# Patient Record
Sex: Female | Born: 1955 | Race: Black or African American | Hispanic: No | Marital: Married | Smoking: Never smoker
Health system: Southern US, Community
[De-identification: ages and names within clinical notes are randomized; demographics above are authoritative.]

## PROBLEM LIST (undated history)

## (undated) DIAGNOSIS — E7800 Pure hypercholesterolemia, unspecified: Secondary | ICD-10-CM

## (undated) DIAGNOSIS — I10 Essential (primary) hypertension: Secondary | ICD-10-CM

## (undated) DIAGNOSIS — I639 Cerebral infarction, unspecified: Secondary | ICD-10-CM

## (undated) HISTORY — PX: BREAST LUMPECTOMY: SHX2

---

## 2010-02-16 ENCOUNTER — Inpatient Hospital Stay: Payer: Self-pay | Admitting: Internal Medicine

## 2013-11-11 ENCOUNTER — Ambulatory Visit: Payer: Self-pay | Admitting: Family Medicine

## 2013-11-20 ENCOUNTER — Ambulatory Visit: Payer: Self-pay | Admitting: Family Medicine

## 2014-11-13 ENCOUNTER — Ambulatory Visit
Admit: 2014-11-13 | Disposition: A | Discharge status: Home / Self Care | Payer: Self-pay | Attending: Family Medicine | Admitting: Family Medicine

## 2016-05-30 ENCOUNTER — Encounter (HOSPITAL_COMMUNITY): Payer: Self-pay | Admitting: Cardiology

## 2016-05-30 ENCOUNTER — Emergency Department (HOSPITAL_COMMUNITY)
Admission: EM | Admit: 2016-05-30 | Discharge: 2016-05-30 | Disposition: A | Discharge status: Home / Self Care | Payer: Self-pay | Attending: Emergency Medicine | Admitting: Emergency Medicine

## 2016-05-30 DIAGNOSIS — R112 Nausea with vomiting, unspecified: Secondary | ICD-10-CM | POA: Insufficient documentation

## 2016-05-30 DIAGNOSIS — I10 Essential (primary) hypertension: Secondary | ICD-10-CM | POA: Insufficient documentation

## 2016-05-30 DIAGNOSIS — Z79899 Other long term (current) drug therapy: Secondary | ICD-10-CM | POA: Insufficient documentation

## 2016-05-30 DIAGNOSIS — R197 Diarrhea, unspecified: Secondary | ICD-10-CM | POA: Insufficient documentation

## 2016-05-30 DIAGNOSIS — Z7982 Long term (current) use of aspirin: Secondary | ICD-10-CM | POA: Insufficient documentation

## 2016-05-30 HISTORY — DX: Pure hypercholesterolemia, unspecified: E78.00

## 2016-05-30 HISTORY — DX: Essential (primary) hypertension: I10

## 2016-05-30 LAB — COMPREHENSIVE METABOLIC PANEL
ALBUMIN: 4.2 g/dL (ref 3.5–5.0)
ALT: 13 U/L — ABNORMAL LOW (ref 14–54)
ANION GAP: 7 (ref 5–15)
AST: 18 U/L (ref 15–41)
Alkaline Phosphatase: 90 U/L (ref 38–126)
BUN: 27 mg/dL — ABNORMAL HIGH (ref 6–20)
CHLORIDE: 103 mmol/L (ref 101–111)
CO2: 26 mmol/L (ref 22–32)
Calcium: 9.8 mg/dL (ref 8.9–10.3)
Creatinine, Ser: 1.14 mg/dL — ABNORMAL HIGH (ref 0.44–1.00)
GFR calc Af Amer: >60 mL/min (ref >60)
GFR calc non Af Amer: 52 mL/min — ABNORMAL LOW (ref >60)
GLUCOSE: 122 mg/dL — AB (ref 65–99)
POTASSIUM: 3.6 mmol/L (ref 3.5–5.1)
Sodium: 136 mmol/L (ref 135–145)
Total Bilirubin: 0.7 mg/dL (ref 0.3–1.2)
Total Protein: 8.1 g/dL (ref 6.5–8.1)

## 2016-05-30 LAB — CBC WITH DIFFERENTIAL/PLATELET
Basophils Absolute: 0 10*3/uL (ref 0.0–0.1)
Basophils Relative: 0 %
Eosinophils Absolute: 0 10*3/uL (ref 0.0–0.7)
Eosinophils Relative: 0 %
HEMATOCRIT: 34.3 % — AB (ref 36.0–46.0)
Hemoglobin: 11.2 g/dL — ABNORMAL LOW (ref 12.0–15.0)
LYMPHS PCT: 15 %
Lymphs Abs: 1.7 10*3/uL (ref 0.7–4.0)
MCH: 29.4 pg (ref 26.0–34.0)
MCHC: 32.7 g/dL (ref 30.0–36.0)
MCV: 90 fL (ref 78.0–100.0)
MONO ABS: 0.4 10*3/uL (ref 0.1–1.0)
Monocytes Relative: 3 %
Neutro Abs: 9.7 10*3/uL — ABNORMAL HIGH (ref 1.7–7.7)
Neutrophils Relative %: 82 %
Platelets: 347 10*3/uL (ref 150–400)
RBC: 3.81 MIL/uL — ABNORMAL LOW (ref 3.87–5.11)
RDW: 12.9 % (ref 11.5–15.5)
WBC: 11.9 10*3/uL — ABNORMAL HIGH (ref 4.0–10.5)

## 2016-05-30 LAB — LIPASE, BLOOD: Lipase: 22 U/L (ref 11–51)

## 2016-05-30 MED ORDER — ONDANSETRON HCL 8 MG PO TABS
8.0000 mg | ORAL_TABLET | Freq: Three times a day (TID) | ORAL | 0 refills | Status: DC | PRN
Start: 2016-05-30 — End: 2021-11-19

## 2016-05-30 MED ORDER — ONDANSETRON HCL 4 MG/2ML IJ SOLN
4.0000 mg | Freq: Once | INTRAMUSCULAR | Status: AC
  Administered 2016-05-30: 4 mg via INTRAVENOUS
  Filled 2016-05-30: qty 2

## 2016-05-30 MED ORDER — SODIUM CHLORIDE 0.9 % IV BOLUS (SEPSIS)
1000.0000 mL | Freq: Once | INTRAVENOUS | Status: AC
  Administered 2016-05-30: 1000 mL via INTRAVENOUS

## 2016-05-30 NOTE — ED Notes (Signed)
Pt states understanding of care given and follow up instructions.  Pt A/O x4, ambulated from ED with steady gait

## 2016-05-30 NOTE — ED Provider Notes (Signed)
[facility]-EMERGENCY DEPT Provider Note   CSN: [id number] Arrival date & time: 05/30/16  1730     History   Chief Complaint Chief Complaint  Patient presents with  . Weakness    HPI Claire Hunt is a 60 y.o. female. She presents for evaluation of nausea, vomiting and diarrhea, which started, last evening. She denies fever, chills, cough, shortness of breath, chest pain, abdominal pain, back pain, weakness or dizziness. There are no other known modifying factors.  HPI  Past Medical History:  Diagnosis Date  . Hypercholesteremia   . Hypertension     There are no active problems to display for this patient.   Past Surgical History:  Procedure Laterality Date  . BREAST LUMPECTOMY      OB History    No data available       Home Medications    Prior to Admission medications   Medication Sig Start Date End Date Taking? Authorizing Provider  aspirin EC 81 MG tablet Take 81 mg by mouth every morning.   Yes Historical Provider, MD  atorvastatin (LIPITOR) 40 MG tablet Take 40 mg by mouth every evening.    Yes Historical Provider, MD  hydrochlorothiazide (HYDRODIURIL) 12.5 MG tablet Take 12.5 mg by mouth daily.   Yes Historical Provider, MD  lisinopril (PRINIVIL,ZESTRIL) 40 MG tablet Take 40 mg by mouth daily.   Yes Historical Provider, MD  metoprolol tartrate (LOPRESSOR) 25 MG tablet Take 25 mg by mouth 2 (two) times daily.   Yes Historical Provider, MD  ondansetron (ZOFRAN) 8 MG tablet Take 1 tablet (8 mg total) by mouth every 8 (eight) hours as needed for nausea or vomiting. 05/30/16   Mancel BaleElliott Eleno Weimar, MD    Family History History reviewed. No pertinent family history.  Social History Social History  Substance Use Topics  . Smoking status: Never Smoker  . Smokeless tobacco: Never Used  . Alcohol use No     Allergies   Patient has no known allergies.   Review of Systems Review of Systems  All other systems reviewed and are negative.    Physical Exam Updated  Vital Signs BP 106/83   Pulse 69   Temp 98.2 F (36.8 C) (Oral)   Resp 15   Ht 5\' 1"  (1.549 m)   Wt 155 lb (70.3 kg)   SpO2 100%   BMI 29.29 kg/m   Physical Exam  Constitutional: She is oriented to person, place, and time. She appears well-developed and well-nourished. No distress.  HENT:  Head: Normocephalic and atraumatic.  Eyes: Conjunctivae and EOM are normal. Pupils are equal, round, and reactive to light.  Neck: Normal range of motion and phonation normal. Neck supple.  Cardiovascular: Normal rate and regular rhythm.   Pulmonary/Chest: Effort normal and breath sounds normal. She exhibits no tenderness.  Abdominal: Soft. She exhibits no distension. There is no tenderness. There is no guarding.  Musculoskeletal: Normal range of motion.  Neurological: She is alert and oriented to person, place, and time. She exhibits normal muscle tone.  Skin: Skin is warm and dry.  Psychiatric: She has a normal mood and affect. Her behavior is normal. Judgment and thought content normal.  Nursing note and vitals reviewed.    ED Treatments / Results  Labs (all labs ordered are listed, but only abnormal results are displayed) Labs Reviewed  COMPREHENSIVE METABOLIC PANEL - Abnormal; Notable for the following:       Result Value   Glucose, Bld 122 (*)    BUN 27 (*)  Creatinine, Ser 1.14 (*)    ALT 13 (*)    GFR calc non Af Amer 52 (*)    All other components within normal limits  CBC WITH DIFFERENTIAL/PLATELET - Abnormal; Notable for the following:    WBC 11.9 (*)    RBC 3.81 (*)    Hemoglobin 11.2 (*)    HCT 34.3 (*)    Neutro Abs 9.7 (*)    All other components within normal limits  LIPASE, BLOOD    EKG  EKG Interpretation  Date/Time:  Tuesday May 30 2016 17:39:54 EST Ventricular Rate:  57 PR Interval:    QRS Duration: 83 QT Interval:  421 QTC Calculation: 410 R Axis:   55 Text Interpretation:  zSinus rhythm Borderline prolonged PR interval Consider left atrial  enlargement Borderline ST elevation, inferior leads Baseline wander in lead(s) V2 since last tracing no significant change Confirmed by Effie Shy  MD, Dwayn Moravek ([id number]) on 05/30/2016 5:43:38 PM       Radiology No results found.  Procedures Procedures (including critical care time)  Medications Ordered in ED Medications  ondansetron (ZOFRAN) injection 4 mg (4 mg Intravenous Given 05/30/16 1811)  sodium chloride 0.9 % bolus 1,000 mL (0 mLs Intravenous Stopped 05/30/16 1951)     Initial Impression / Assessment and Plan / ED Course  I have reviewed the triage vital signs and the nursing notes.  Pertinent labs & imaging results that were available during my care of the patient were reviewed by me and considered in my medical decision making (see chart for details).  Clinical Course as of May 30 2040  Tue May 30, 2016  1845 High WBC: (!) 11.9 [EW]  1845 Low Hemoglobin: (!) 11.2 [EW]  1845 Slight elevation Glucose: (!) 122 [EW]  1845 Slight elevation BUN: (!) 27 [EW]  1845 Mild elevation Creatinine: (!) 1.14 [EW]    Clinical Course User Index [EW] Mancel Bale, MD    Medications  ondansetron [facility]) injection 4 mg (4 mg Intravenous Given 05/30/16 1811)  sodium chloride 0.9 % bolus 1,000 mL (0 mLs Intravenous Stopped 05/30/16 1951)    Patient Vitals for the past 24 hrs:  BP Temp Temp src Pulse Resp SpO2 Height Weight  05/30/16 1900 106/83 - - 69 15 100 % - -  05/30/16 1800 153/68 - - 62 18 99 % - -  05/30/16 1736 169/67 98.2 F (36.8 C) Oral (!) 58 16 99 % - -  05/30/16 1734 - - - - - - 5\' 1"  (1.549 m) 155 lb (70.3 kg)    8:38 PM Reevaluation with update and discussion. After initial assessment and treatment, an updated evaluation reveals No additional complaints. She has been able to tolerate just a couple ounces of oral liquids without vomiting. No diarrhea since arrival here. Findings discussed with patient and family members, all questions answered. [name]    Final  Clinical Impressions(s) / ED Diagnoses   Final diagnoses:  Nausea vomiting and diarrhea    Nonspecific, nausea, vomiting, diarrhea. Doubt serious bacterial patient. Metabolic instability or impending vascular collapse.  Nursing Notes Reviewed/ Care Coordinated Applicable Imaging Reviewed Interpretation of Laboratory Data incorporated into ED treatment  The patient appears reasonably screened and/or stabilized for discharge and I doubt any other medical condition or other [facility] requiring further screening, evaluation, or treatment in the ED at this time prior to discharge.  Plan: Home Medications- continue; Home Treatments- rest; return here if the recommended treatment, does not improve the symptoms; Recommended follow up-  PCP prn   New Prescriptions New Prescriptions   ONDANSETRON (ZOFRAN) 8 MG TABLET    Take 1 tablet (8 mg total) by mouth every 8 (eight) hours as needed for nausea or vomiting.     Mancel BaleElliott Jessie Cowher, MD 05/30/16 2041

## 2016-05-30 NOTE — ED Triage Notes (Signed)
Vomiting and diarrhea times 2 days.  Weakness since yesterday.  Near syncope episode while EMS was checking orthostatics.

## 2016-05-30 NOTE — Discharge Instructions (Signed)
Start with a clear liquid diet, then gradually advance to regular foods, over one or 2 days time.  You can use Imodium or Kaopectate for diarrhea if needed.

## 2016-05-30 NOTE — ED Notes (Signed)
Pt given ginger ale, no c/o nausea or vomiting

## 2016-06-01 ENCOUNTER — Encounter (HOSPITAL_COMMUNITY): Payer: Self-pay

## 2016-06-01 ENCOUNTER — Inpatient Hospital Stay (HOSPITAL_COMMUNITY)
Admission: EM | Admit: 2016-06-01 | Discharge: 2016-06-03 | DRG: 065 | Disposition: A | Discharge status: Home Health | Payer: Self-pay | Attending: Internal Medicine | Admitting: Internal Medicine

## 2016-06-01 DIAGNOSIS — I639 Cerebral infarction, unspecified: Principal | ICD-10-CM | POA: Diagnosis present

## 2016-06-01 DIAGNOSIS — E785 Hyperlipidemia, unspecified: Secondary | ICD-10-CM | POA: Diagnosis present

## 2016-06-01 DIAGNOSIS — Z7982 Long term (current) use of aspirin: Secondary | ICD-10-CM

## 2016-06-01 DIAGNOSIS — M6281 Muscle weakness (generalized): Secondary | ICD-10-CM

## 2016-06-01 DIAGNOSIS — I10 Essential (primary) hypertension: Secondary | ICD-10-CM | POA: Diagnosis present

## 2016-06-01 DIAGNOSIS — G8191 Hemiplegia, unspecified affecting right dominant side: Secondary | ICD-10-CM | POA: Diagnosis present

## 2016-06-01 DIAGNOSIS — Z7902 Long term (current) use of antithrombotics/antiplatelets: Secondary | ICD-10-CM

## 2016-06-01 DIAGNOSIS — E7800 Pure hypercholesterolemia, unspecified: Secondary | ICD-10-CM | POA: Diagnosis present

## 2016-06-01 NOTE — ED Triage Notes (Signed)
Pt states starting Monday she has started feeling weakness to the right side of her body with some slurring of her speech.  Sister reports seeing patient on Tuesday with same symptoms but is worse today.

## 2016-06-02 ENCOUNTER — Observation Stay (HOSPITAL_COMMUNITY): Payer: Self-pay

## 2016-06-02 ENCOUNTER — Other Ambulatory Visit (HOSPITAL_COMMUNITY): Payer: Self-pay

## 2016-06-02 ENCOUNTER — Observation Stay (HOSPITAL_BASED_OUTPATIENT_CLINIC_OR_DEPARTMENT_OTHER): Payer: Self-pay

## 2016-06-02 ENCOUNTER — Emergency Department (HOSPITAL_COMMUNITY): Payer: Self-pay

## 2016-06-02 DIAGNOSIS — E784 Other hyperlipidemia: Secondary | ICD-10-CM

## 2016-06-02 DIAGNOSIS — I639 Cerebral infarction, unspecified: Secondary | ICD-10-CM | POA: Diagnosis present

## 2016-06-02 DIAGNOSIS — E7800 Pure hypercholesterolemia, unspecified: Secondary | ICD-10-CM

## 2016-06-02 DIAGNOSIS — E785 Hyperlipidemia, unspecified: Secondary | ICD-10-CM | POA: Diagnosis present

## 2016-06-02 DIAGNOSIS — I10 Essential (primary) hypertension: Secondary | ICD-10-CM

## 2016-06-02 DIAGNOSIS — I679 Cerebrovascular disease, unspecified: Secondary | ICD-10-CM

## 2016-06-02 LAB — COMPREHENSIVE METABOLIC PANEL
ALBUMIN: 4.1 g/dL (ref 3.5–5.0)
ALK PHOS: 76 U/L (ref 38–126)
ALT: 12 U/L — AB (ref 14–54)
ANION GAP: 7 (ref 5–15)
AST: 15 U/L (ref 15–41)
BUN: 20 mg/dL (ref 6–20)
CALCIUM: 10 mg/dL (ref 8.9–10.3)
CO2: 27 mmol/L (ref 22–32)
CREATININE: 1.28 mg/dL — AB (ref 0.44–1.00)
Chloride: 103 mmol/L (ref 101–111)
GFR calc Af Amer: 52 mL/min — ABNORMAL LOW (ref >60)
GFR calc non Af Amer: 45 mL/min — ABNORMAL LOW (ref >60)
GLUCOSE: 113 mg/dL — AB (ref 65–99)
Potassium: 3.8 mmol/L (ref 3.5–5.1)
SODIUM: 137 mmol/L (ref 135–145)
Total Bilirubin: 0.6 mg/dL (ref 0.3–1.2)
Total Protein: 7.6 g/dL (ref 6.5–8.1)

## 2016-06-02 LAB — LIPID PANEL
Cholesterol: 224 mg/dL — ABNORMAL HIGH (ref 0–200)
HDL: 54 mg/dL (ref >40)
LDL CALC: 146 mg/dL — AB (ref 0–99)
TRIGLYCERIDES: 119 mg/dL (ref <150)
Total CHOL/HDL Ratio: 4.1 RATIO
VLDL: 24 mg/dL (ref 0–40)

## 2016-06-02 LAB — CBC
HEMATOCRIT: 34.6 % — AB (ref 36.0–46.0)
Hemoglobin: 11.3 g/dL — ABNORMAL LOW (ref 12.0–15.0)
MCH: 29.3 pg (ref 26.0–34.0)
MCHC: 32.7 g/dL (ref 30.0–36.0)
MCV: 89.6 fL (ref 78.0–100.0)
Platelets: 357 10*3/uL (ref 150–400)
RBC: 3.86 MIL/uL — AB (ref 3.87–5.11)
RDW: 12.8 % (ref 11.5–15.5)
WBC: 9.7 10*3/uL (ref 4.0–10.5)

## 2016-06-02 LAB — URINALYSIS, ROUTINE W REFLEX MICROSCOPIC
Bilirubin Urine: NEGATIVE
Glucose, UA: NEGATIVE mg/dL
Ketones, ur: NEGATIVE mg/dL
NITRITE: NEGATIVE
PROTEIN: NEGATIVE mg/dL
Specific Gravity, Urine: 1.02 (ref 1.005–1.030)
pH: 5.5 (ref 5.0–8.0)

## 2016-06-02 LAB — DIFFERENTIAL
Basophils Absolute: 0 10*3/uL (ref 0.0–0.1)
Basophils Relative: 0 %
Eosinophils Absolute: 0.2 10*3/uL (ref 0.0–0.7)
Eosinophils Relative: 2 %
LYMPHS ABS: 2.9 10*3/uL (ref 0.7–4.0)
LYMPHS PCT: 30 %
Monocytes Absolute: 0.4 10*3/uL (ref 0.1–1.0)
Monocytes Relative: 4 %
NEUTROS ABS: 6.2 10*3/uL (ref 1.7–7.7)
NEUTROS PCT: 64 %

## 2016-06-02 LAB — APTT: aPTT: 35 seconds (ref 24–36)

## 2016-06-02 LAB — RAPID URINE DRUG SCREEN, HOSP PERFORMED
Amphetamines: NOT DETECTED
BARBITURATES: NOT DETECTED
BENZODIAZEPINES: NOT DETECTED
Cocaine: NOT DETECTED
Opiates: NOT DETECTED
Tetrahydrocannabinol: NOT DETECTED

## 2016-06-02 LAB — TROPONIN I

## 2016-06-02 LAB — MRSA PCR SCREENING: MRSA BY PCR: NEGATIVE

## 2016-06-02 LAB — ETHANOL

## 2016-06-02 LAB — ECHOCARDIOGRAM COMPLETE
Height: 61 in
WEIGHTICAEL: 2582.03 [oz_av]

## 2016-06-02 LAB — PROTIME-INR
INR: 0.96
Prothrombin Time: 12.8 seconds (ref 11.4–15.2)

## 2016-06-02 LAB — URINE MICROSCOPIC-ADD ON

## 2016-06-02 MED ORDER — ATORVASTATIN CALCIUM 40 MG PO TABS
40.0000 mg | ORAL_TABLET | Freq: Every evening | ORAL | Status: DC
  Administered 2016-06-02: 40 mg via ORAL
  Filled 2016-06-02: qty 1

## 2016-06-02 MED ORDER — CLOPIDOGREL BISULFATE 75 MG PO TABS: 75.0000 mg | ORAL_TABLET | Freq: Every day | ORAL | 0 refills | Status: DC

## 2016-06-02 MED ORDER — CLOPIDOGREL BISULFATE 75 MG PO TABS
75.0000 mg | ORAL_TABLET | Freq: Every day | ORAL | Status: DC
  Administered 2016-06-02 – 2016-06-03 (×2): 75 mg via ORAL
  Filled 2016-06-02 (×2): qty 1

## 2016-06-02 MED ORDER — SODIUM CHLORIDE 0.9 % IV SOLN
INTRAVENOUS | Status: DC
  Administered 2016-06-02: 07:00:00 via INTRAVENOUS

## 2016-06-02 MED ORDER — ASPIRIN EC 325 MG PO TBEC
325.0000 mg | DELAYED_RELEASE_TABLET | Freq: Every morning | ORAL | Status: DC
  Administered 2016-06-03: 325 mg via ORAL
  Filled 2016-06-02: qty 1

## 2016-06-02 MED ORDER — STROKE: EARLY STAGES OF RECOVERY BOOK
Freq: Once | Status: DC
  Filled 2016-06-02: qty 1

## 2016-06-02 MED ORDER — METOPROLOL TARTRATE 25 MG PO TABS
25.0000 mg | ORAL_TABLET | Freq: Two times a day (BID) | ORAL | Status: DC
  Administered 2016-06-02 – 2016-06-03 (×3): 25 mg via ORAL
  Filled 2016-06-02 (×3): qty 1

## 2016-06-02 MED ORDER — ASPIRIN EC 81 MG PO TBEC
81.0000 mg | DELAYED_RELEASE_TABLET | Freq: Every morning | ORAL | Status: DC
  Administered 2016-06-02: 81 mg via ORAL
  Filled 2016-06-02: qty 1

## 2016-06-02 MED ORDER — SENNOSIDES-DOCUSATE SODIUM 8.6-50 MG PO TABS: 1.0000 | ORAL_TABLET | Freq: Every evening | ORAL | Status: DC | PRN

## 2016-06-02 MED ORDER — HEPARIN SODIUM (PORCINE) 5000 UNIT/ML IJ SOLN
5000.0000 [IU] | Freq: Three times a day (TID) | INTRAMUSCULAR | Status: DC
  Administered 2016-06-02 – 2016-06-03 (×4): 5000 [IU] via SUBCUTANEOUS
  Filled 2016-06-02 (×3): qty 1

## 2016-06-02 NOTE — H&P (Signed)
History and Physical    Claire Hunt NWG:[id number] DOB: [birth year] DOA: 06/01/2016  [facility]: [facility]  Patient coming from: Home.    Chief Complaint:  Right sided weakness with ictus 3 days PTA.   HPI: Claire Hunt is a right handed 60 y.o. female with hx of HLD, HTN, seen in ER 3 days ago for GI symptoms with resolved, returned today as she has right sided weakness unresolved.   She lives at home with her husband and her mother, and her mother had GI symptoms.  She noted slurred speech started 3 days PTA, and also had right sided weakness.  Her sister said she has significant stress at home.  She has no visual problem and no HA.  Work up in the ER included a head CT which was negative.  Hospiatist was asked to admit her for stroke work up.  She is out of the TPA window, and she has been compliant with her daily ASA.   ED Course:  See above.  Rewiew of Systems:  Constitutional: Negative for malaise, fever and chills. No significant weight loss or weight gain Eyes: Negative for eye pain, redness and discharge, diplopia, visual changes, or flashes of light. ENMT: Negative for ear pain, hoarseness, nasal congestion, sinus pressure and sore throat. No headaches; tinnitus, drooling, or problem swallowing. Cardiovascular: Negative for chest pain, palpitations, diaphoresis, dyspnea and peripheral edema. ; No orthopnea, PND Respiratory: Negative for cough, hemoptysis, wheezing and stridor. No pleuritic chestpain. Gastrointestinal: Negative for diarrhea, constipation,  melena, blood in stool, hematemesis, jaundice and rectal bleeding.    Genitourinary: Negative for frequency, dysuria, incontinence,flank pain and hematuria; Musculoskeletal: Negative for back pain and neck pain. Negative for swelling and trauma.;  Skin: . Negative for pruritus, rash, abrasions, bruising and skin lesion.; ulcerations Neuro: Negative for headache, lightheadedness and neck stiffness. Negative  altered level of consciousness ,burning feet, involuntary movement, seizure and syncope.  Psych: negative for anxiety, depression, insomnia, tearfulness, panic attacks, hallucinations, paranoia, suicidal or homicidal ideation    Past Medical History:  Diagnosis Date  . Hypercholesteremia   . Hypertension     Past Surgical History:  Procedure Laterality Date  . BREAST LUMPECTOMY      Prior to Admission medications   Medication Sig Start Date End Date Taking? Authorizing Provider  aspirin EC 81 MG tablet Take 81 mg by mouth every morning.   Yes Historical Provider, MD  atorvastatin (LIPITOR) 40 MG tablet Take 40 mg by mouth every evening.    Yes Historical Provider, MD  hydrochlorothiazide (HYDRODIURIL) 12.5 MG tablet Take 12.5 mg by mouth daily.   Yes Historical Provider, MD  lisinopril (PRINIVIL,ZESTRIL) 40 MG tablet Take 40 mg by mouth daily.   Yes Historical Provider, MD  metoprolol tartrate (LOPRESSOR) 25 MG tablet Take 25 mg by mouth 2 (two) times daily.   Yes Historical Provider, MD  ondansetron (ZOFRAN) 8 MG tablet Take 1 tablet (8 mg total) by mouth every 8 (eight) hours as needed for nausea or vomiting. 05/30/16   Mancel Bale, MD    Physical Exam: Vitals:   06/02/16 0100 06/02/16 0130 06/02/16 0236 06/02/16 0300  BP: 138/68 133/86 134/68 134/79  Pulse: (!) 52 (!) 52 (!) 59 (!) 51  Resp: 13 19 21 19   Temp:      SpO2: 100% 100% 100% 99%  Weight:      Height:        Constitutional: NAD, calm, comfortable Vitals:   06/02/16 0100 06/02/16 0130 06/02/16  0236 06/02/16 0300  BP: 138/68 133/86 134/68 134/79  Pulse: (!) 52 (!) 52 (!) 59 (!) 51  Resp: 13 19 21 19   Temp:      SpO2: 100% 100% 100% 99%  Weight:      Height:       Eyes: PERRL, lids and conjunctivae normal ENMT: Mucous membranes are moist. Posterior pharynx clear of any exudate or lesions.Normal dentition.  Neck: normal, supple, no masses, no thyromegaly Respiratory: clear to auscultation bilaterally, no  wheezing, no crackles. Normal respiratory effort. No accessory muscle use.  Cardiovascular: Regular rate and rhythm, no murmurs / rubs / gallops. No extremity edema. 2+ pedal pulses. No carotid bruits.  Abdomen: no tenderness, no masses palpated. No hepatosplenomegaly. Bowel sounds positive.  Musculoskeletal: no clubbing / cyanosis. Her right side is weaker than her left side. Speech is fluent and  Good ROM, no contractures. Normal muscle tone.  Skin: no rashes, lesions, ulcers. No induration Neurologic: CN 2-12 grossly intact. Sensation intact, DTR normal. Strength 5/5 in all 4.  Psychiatric: Normal judgment and insight. Alert and oriented x 3. Normal mood.    Labs on Admission: I have personally reviewed following labs and imaging studies CBC:  Recent Labs Lab 05/30/16 1807 06/02/16 0126  WBC 11.9* 9.7  NEUTROABS 9.7* 6.2  HGB 11.2* 11.3*  HCT 34.3* 34.6*  MCV 90.0 89.6  PLT 347 357   Basic Metabolic Panel:  Recent Labs Lab 05/30/16 1807 06/02/16 0126  NA 136 137  K 3.6 3.8  CL 103 103  CO2 26 27  GLUCOSE 122* 113*  BUN 27* 20  CREATININE 1.14* 1.28*  CALCIUM 9.8 10.0   GFR: Estimated Creatinine Clearance: 43 mL/min (by C-G formula based on SCr of 1.28 mg/dL (H)). Liver Function Tests:  Recent Labs Lab 05/30/16 1807 06/02/16 0126  AST 18 15  ALT 13* 12*  ALKPHOS 90 76  BILITOT 0.7 0.6  PROT 8.1 7.6  ALBUMIN 4.2 4.1    Recent Labs Lab 05/30/16 1807  LIPASE 22   Coagulation Profile:  Recent Labs Lab 06/02/16 0126  INR 0.96   Cardiac Enzymes:  Recent Labs Lab 06/02/16 0126  TROPONINI <0.03    Radiological Exams on Admission: Ct Head Wo Contrast  Result Date: 06/02/2016 CLINICAL DATA:  Right-sided weakness for 3 days EXAM: CT HEAD WITHOUT CONTRAST TECHNIQUE: Contiguous axial images were obtained from the base of the skull through the vertex without intravenous contrast. COMPARISON:  None. FINDINGS: Brain: No mass lesion, intraparenchymal  hemorrhage or extra-axial collection. No evidence of acute cortical infarct. Brain parenchyma and CSF-containing spaces are normal for age. Vascular: No hyperdense vessel or unexpected calcification. Skull: Normal visualized skull base, calvarium and extracranial soft tissues. Sinuses/Orbits: No sinus fluid levels or advanced mucosal thickening. No mastoid effusion. Normal orbits. IMPRESSION: Normal head CT. Electronically Signed   By: Deatra RobinsonKevin  Herman M.D.   On: 06/02/2016 01:55    EKG: Independently reviewed.  Assessment/Plan Principal Problem:   Acute cerebrovascular accident (CVA) (HCC) Active Problems:   HTN (hypertension)   HLD (hyperlipidemia)   Stroke (cerebrum) (HCC)   PLAN:   Acute Left hemispheric CVA:  I suspect she had an acute CVA and though CT of the head should have shown it, her head CT was negative.  Nevertheless, will add plavix to her ASA, and maintain on DAT at this time.  Will obtain full stroke work up to include MRI/MRA, Carotid doppler, and ECHO.  Continue with her Statin.   HTN:  Will hold her diuretic and BP meds.  Her SBP is only  130 at this time.    HLD:  She has been compliant with her statin.  Will continue with it.    DVT prophylaxis: SQ Heparin.  Code Status: FULL CODE>  Family Communication: Older sister at bedside.  Disposition Plan: to home when appropriate.  Consults called: None.  Admission status: OBS.    Rynell Ciotti MD FACP. [facility]  If 7PM-7AM, please contact night-coverage [email or web address] Password [email or web address]  06/02/2016, 3:31 AM

## 2016-06-02 NOTE — Evaluation (Signed)
Speech Language Pathology Evaluation Patient Details Name: Claire SalmonRebecca Hunt MRN: [id number] DOB: 09-20-1955 Today's Date: 06/02/2016 Time: [id number] SLP Time Calculation (min) (ACUTE ONLY): 12 min  Problem List:  Patient Active Problem List   Diagnosis Date Noted  . Acute cerebrovascular accident (CVA) (HCC) 06/02/2016  . HTN (hypertension) 06/02/2016  . HLD (hyperlipidemia) 06/02/2016  . Stroke (cerebrum) (HCC) 06/02/2016   Past Medical History:  Past Medical History:  Diagnosis Date  . Hypercholesteremia   . Hypertension    Past Surgical History:  Past Surgical History:  Procedure Laterality Date  . BREAST LUMPECTOMY     HPI:  Pt is an 60 y.o.femalewith hx of HLD, HTN, seen in ER 3 days ago for GI symptoms whichresolved, patient returned withright sided weakness that was unresolved. Patientlives at home with her husband and her mother, and her mother had GI symptoms. She noted slurred speech started 3 days PTA, and also had right sided weakness. Her sister said she has significant stress at home. She has no visual problem and no HA. 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    Assessment / Plan / Recommendation Clinical Impression  Pt presents with mild cognitive deficits, difficult to determine if different than baseline. Pt reports PLOF she lives with disabled spouse and elderly mother and is their primary caregiver for needs. Pt also works full time and was independent with all ADLs. SLP noted reduced self monitoring, reduced executive functioning skills, and decreased novel recall. Pts receptive and expressive langauge abilities appear intact. Motor speech skills appear to have resolved since onset of CVA symptoms. Recommend HH ST to ensure safety with complex ADLs.     SLP Assessment  Patient needs continued Speech Lanaguage Pathology Services    Follow Up Recommendations  Home health SLP    Frequency and Duration           SLP  Evaluation Cognition  Overall Cognitive Status: Difficult to assess Arousal/Alertness: Awake/alert Orientation Level: Oriented X4 Executive Function: Self Monitoring;Organizing;Sequencing       Comprehension  Auditory Comprehension Overall Auditory Comprehension: Appears within functional limits for tasks assessed Visual Recognition/Discrimination Discrimination: Within Function Limits Reading Comprehension Reading Status: Within funtional limits    Expression Expression Primary Mode of Expression: Verbal Verbal Expression Overall Verbal Expression: Appears within functional limits for tasks assessed Written Expression Dominant Hand: Right   Oral / Motor  Oral Motor/Sensory Function Overall Oral Motor/Sensory Function: Within functional limits Motor Speech Overall Motor Speech: Appears within functional limits for tasks assessed   GO                   Marcene Duoshelsea Sumney MA, CCC-SLP Acute Care Speech Language Pathologist    Kennieth RadSumney, Christoph Copelan E 06/02/2016, 5:17 PM

## 2016-06-02 NOTE — Evaluation (Signed)
Occupational Therapy Evaluation Patient Details Name: Claire Hunt Menzie MRN: [id number] DOB: 01-Aug-1955 Today's Date: 06/02/2016    History of Present Illness Claire Hunt Lungren is a right handed 60 y.o. female with hx of HLD, HTN, seen in ER 3 days ago for GI symptoms with resolved, returned today as she has right sided weakness unresolved.   She lives at home with her husband and her mother, and her mother had GI symptoms.  She noted slurred speech started 3 days PTA, and also had right sided weakness.  Her sister said she has significant stress at home.  She has no visual problem and no HA.  Hospiatist was asked to admit her for stroke work up.  She is out of the TPA window, and she has been compliant with her daily ASA.  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    Clinical Impression   Pt awake, alert, oriented x4 this am, agreeable to OT evaluation. Pt reports her symptoms have improved since admission, continues to have difficulty with right hand "feeling heavy." Pt is able to complete ADL tasks at baseline independence, coordination and sensation are intact with formal and functional testing. Pt RUE strength is 4-/5 throughout. Discussed importance of continuing to practice using RUE as dominant during daily tasks to promote further improvement in strength and coordination. Recommend outpatient OT evaluation if pt continues to have RUE weakness and coordination deficits during functional tasks on discharge.     Follow Up Recommendations  Outpatient OT    Equipment Recommendations  None recommended by OT       Precautions / Restrictions Precautions Precautions: None Restrictions Weight Bearing Restrictions: No      Mobility Bed Mobility Overal bed mobility: Modified Independent                Transfers Overall transfer level: Modified independent Equipment used: None                       ADL Overall ADL's : Modified independent;At baseline                                              Vision Vision Assessment?: No apparent visual deficits          Pertinent Vitals/Pain Pain Assessment: No/denies pain     Hand Dominance Right   Extremity/Trunk Assessment Upper Extremity Assessment Upper Extremity Assessment: RUE deficits/detail RUE Deficits / Details: RUE strength 4-/5 throughout   Lower Extremity Assessment Lower Extremity Assessment: Defer to PT evaluation   Cervical / Trunk Assessment Cervical / Trunk Assessment: Normal   Communication Communication Communication: No difficulties   Cognition Arousal/Alertness: Awake/alert Behavior During Therapy: WFL for tasks assessed/performed Overall Cognitive Status: Within Functional Limits for tasks assessed                                Home Living Family/patient expects to be discharged to:: Private residence Living Arrangements: Spouse/significant other;Parent (mother) Available Help at Discharge: Family;Available PRN/intermittently Type of Home: House Home Access: Level entry     Home Layout: Other (Comment) (2 stairs inside)     Bathroom Shower/Tub: Chief Strategy OfficerTub/shower unit   Bathroom Toilet: Standard     Home Equipment: Shower seat;Grab bars - tub/shower          Prior Functioning/Environment  Level of Independence: Independent        Comments: Works; cares for husband        OT Problem List: Decreased strength;Decreased coordination    End of Session Equipment Utilized During Treatment: Gait belt  Activity Tolerance: Patient tolerated treatment well Patient left: in chair;with call bell/phone within reach   Time: [phone] OT Time Calculation (min): 20 min Charges:  OT General Charges $OT Visit: 1 Procedure OT Evaluation $OT Eval Low Complexity: 1 Procedure G-Codes: OT G-codes **NOT FOR INPATIENT CLASS** Functional Assessment Tool Used: clinical judgement Functional Limitation: Self care Self Care Current  Status (V4098(G8987): At least 1 percent but less than 20 percent impaired, limited or restricted Self Care Goal Status (J1914(G8988): At least 1 percent but less than 20 percent impaired, limited or restricted Self Care Discharge Status [phone](G8989): At least 1 percent but less than 20 percent impaired, limited or restricted   Ezra SitesLeslie Remus Hagedorn, OTR/L  [phone] 06/02/2016, 8:48 AM

## 2016-06-02 NOTE — Progress Notes (Signed)
PROGRESS NOTE    Antony SalmonRebecca Jim  WUJ:[id number] DOB: 13-Feb-1956 DOA: 06/01/2016 [facility]    Brief Narrative:  60 y.o. female with hx of HLD, HTN, seen in ER 3 days ago for GI symptoms which resolved, patient returned with right sided weakness that was unresolved.   Patient lives at home with her husband and her mother, and her mother had GI symptoms.  She noted slurred speech started 3 days PTA, and also had right sided weakness.  Her sister said she has significant stress at home.  She has no visual problem and no HA.  Work up in the ER included a head CT which was negative.  Hospiatist was asked to admit her for stroke work up.  She is out of the TPA window, and she has been compliant with her daily ASA.   Assessment & Plan:   Principal Problem:   Acute cerebrovascular accident (CVA) (HCC) Active Problems:   HTN (hypertension)   HLD (hyperlipidemia)   Stroke (cerebrum) (HCC)  1. Acute L pontine CVA 1. Confirmed on MRI brain 2. Radiology studies reviewed. Carotid dopplers without evidence of stenosis 3. 2d echo performed, pending results 4. Have consulted Neurology 5. Seen by PT and SLP with no needs 6. Patient had been on ASA, plavix was added 2. HTN 1. BP stable at present and controlled 2. Continue to monitor 3. HLD 1. Patient on statin  DVT prophylaxis: Heparin subQ Code Status: Full Family Communication: Pt in room Disposition Plan: Possible home in 24hrs  Consultants:   Neurology  Procedures:     Antimicrobials: Anti-infectives    None      Subjective: No complaints today  Objective: Vitals:   06/02/16 0959 06/02/16 1004 06/02/16 1231 06/02/16 1300  BP: (!) 143/72   116/70  Pulse: 75     Resp:    (!) 21  Temp:  97.4 F (36.3 C) 97.8 F (36.6 C)   TempSrc:  Oral Oral   SpO2:      Weight:      Height:        Intake/Output Summary (Last 24 hours) at 06/02/16 1552 Last data filed at 06/02/16 1533  Gross per 24 hour    Intake              240 ml  Output              600 ml  Net             -360 ml   Filed Weights   06/01/16 2350 06/02/16 0500  Weight: 70.3 kg (155 lb) 73.2 kg (161 lb 6 oz)    Examination:  General exam: Appears calm and comfortable  Respiratory system: Clear to auscultation. Respiratory effort normal. Cardiovascular system: S1 & S2 heard, RRR.  Gastrointestinal system: Abdomen is nondistended, soft and nontender. No organomegaly or masses felt. Normal bowel sounds heard. Central nervous system: CN2-12 grossly intact, 4/5 strength in RUE, 5/5 strength throughout, sensation intact throughout. Extremities: perfused, no clubbing Skin: No rashes, lesions or ulcers Psychiatry: Judgement and insight appear normal. Mood & affect appropriate.   Data Reviewed: I have personally reviewed following labs and imaging studies  CBC:  Recent Labs Lab 05/30/16 1807 06/02/16 0126  WBC 11.9* 9.7  NEUTROABS 9.7* 6.2  HGB 11.2* 11.3*  HCT 34.3* 34.6*  MCV 90.0 89.6  PLT 347 357   Basic Metabolic Panel:  Recent Labs Lab 05/30/16 1807 06/02/16 0126  NA 136 137  K 3.6 3.8  CL 103 103  CO2 26 27  GLUCOSE 122* 113*  BUN 27* 20  CREATININE 1.14* 1.28*  CALCIUM 9.8 10.0   GFR: Estimated Creatinine Clearance: 43.9 mL/min (by C-G formula based on SCr of 1.28 mg/dL (H)). Liver Function Tests:  Recent Labs Lab 05/30/16 1807 06/02/16 0126  AST 18 15  ALT 13* 12*  ALKPHOS 90 76  BILITOT 0.7 0.6  PROT 8.1 7.6  ALBUMIN 4.2 4.1    Recent Labs Lab 05/30/16 1807  LIPASE 22   No results for input(s): AMMONIA in the last 168 hours. Coagulation Profile:  Recent Labs Lab 06/02/16 0126  INR 0.96   Cardiac Enzymes:  Recent Labs Lab 06/02/16 0126  TROPONINI <0.03   BNP (last 3 results) No results for input(s): PROBNP in the last 8760 hours. HbA1C: No results for input(s): HGBA1C in the last 72 hours. CBG: No results for input(s): GLUCAP in the last 168 hours. Lipid  Profile:  Recent Labs  06/02/16 0126  CHOL 224*  HDL 54  LDLCALC 146*  TRIG 119  CHOLHDL 4.1   Thyroid Function Tests: No results for input(s): TSH, T4TOTAL, FREET4, T3FREE, THYROIDAB in the last 72 hours. Anemia Panel: No results for input(s): VITAMINB12, FOLATE, FERRITIN, TIBC, IRON, RETICCTPCT in the last 72 hours. Sepsis Labs: No results for input(s): PROCALCITON, LATICACIDVEN in the last 168 hours.  Recent Results (from the past 240 hour(s))  MRSA PCR Screening     Status: None   Collection Time: 06/02/16  4:33 AM  Result Value Ref Range Status   MRSA by PCR NEGATIVE NEGATIVE Final    Comment:        The GeneXpert MRSA Assay (FDA approved for NASAL specimens only), is one component of a comprehensive MRSA colonization surveillance program. It is not intended to diagnose MRSA infection nor to guide or monitor treatment for MRSA infections.      Radiology Studies: Ct Head Wo Contrast  Result Date: 06/02/2016 CLINICAL DATA:  Right-sided weakness for 3 days EXAM: CT HEAD WITHOUT CONTRAST TECHNIQUE: Contiguous axial images were obtained from the base of the skull through the vertex without intravenous contrast. COMPARISON:  None. FINDINGS: Brain: No mass lesion, intraparenchymal hemorrhage or extra-axial collection. No evidence of acute cortical infarct. Brain parenchyma and CSF-containing spaces are normal for age. Vascular: No hyperdense vessel or unexpected calcification. Skull: Normal visualized skull base, calvarium and extracranial soft tissues. Sinuses/Orbits: No sinus fluid levels or advanced mucosal thickening. No mastoid effusion. Normal orbits. IMPRESSION: Normal head CT. Electronically Signed   By: Deatra Robinson M.D.   On: 06/02/2016 01:55   Mr Shirlee Latch RU Contrast  Result Date: 06/02/2016 CLINICAL DATA:  Weakness on the right side of body with slurred speech since Monday. EXAM: MRI HEAD WITHOUT CONTRAST MRA HEAD WITHOUT CONTRAST TECHNIQUE: Multiplanar,  multiecho pulse sequences of the brain and surrounding structures were obtained without intravenous contrast. Angiographic images of the head were obtained using MRA technique without contrast. COMPARISON:  Head CT from earlier today FINDINGS: MRI HEAD FINDINGS Brain: Moderate area of restricted diffusion left of midline in the pons. No superimposed hemorrhage. Few patchy FLAIR hyperintensities in the cerebral white matter consistent with mild chronic microvascular disease in this patient with vascular risk factors. No hydrocephalus, mass, or shift. Vascular: Arterial findings described below. Normal dural venous sinus flow voids. Skull and upper cervical spine: Negative Sinuses/Orbits: Negative MRA HEAD FINDINGS Symmetric carotid and vertebral arteries. Large right posterior communicating artery. Proximal basilar  fenestration. Mild smooth mid basilar narrowing, likely atherosclerotic. Downstream vessels are widely patent. Undulation of the bilateral ICA at the skullbase is likely artifact based on source images. No notable stenosis in the anterior circulation. No branch occlusion or aneurysm. IMPRESSION: 1. Acute, nonhemorrhagic left pontine perforator infarct. 2. Smooth mild narrowing of the mid basilar, presumably atherosclerotic. No acute arterial finding. 3. Mild chronic microvascular disease in the cerebral white matter. Electronically Signed   By: Marnee Spring M.D.   On: 06/02/2016 07:48   Mr Brain Wo Contrast  Result Date: 06/02/2016 CLINICAL DATA:  Weakness on the right side of body with slurred speech since Monday. EXAM: MRI HEAD WITHOUT CONTRAST MRA HEAD WITHOUT CONTRAST TECHNIQUE: Multiplanar, multiecho pulse sequences of the brain and surrounding structures were obtained without intravenous contrast. Angiographic images of the head were obtained using MRA technique without contrast. COMPARISON:  Head CT from earlier today FINDINGS: MRI HEAD FINDINGS Brain: Moderate area of restricted diffusion  left of midline in the pons. No superimposed hemorrhage. Few patchy FLAIR hyperintensities in the cerebral white matter consistent with mild chronic microvascular disease in this patient with vascular risk factors. No hydrocephalus, mass, or shift. Vascular: Arterial findings described below. Normal dural venous sinus flow voids. Skull and upper cervical spine: Negative Sinuses/Orbits: Negative MRA HEAD FINDINGS Symmetric carotid and vertebral arteries. Large right posterior communicating artery. Proximal basilar fenestration. Mild smooth mid basilar narrowing, likely atherosclerotic. Downstream vessels are widely patent. Undulation of the bilateral ICA at the skullbase is likely artifact based on source images. No notable stenosis in the anterior circulation. No branch occlusion or aneurysm. IMPRESSION: 1. Acute, nonhemorrhagic left pontine perforator infarct. 2. Smooth mild narrowing of the mid basilar, presumably atherosclerotic. No acute arterial finding. 3. Mild chronic microvascular disease in the cerebral white matter. Electronically Signed   By: Marnee Spring M.D.   On: 06/02/2016 07:48   US Carotid Bilateral (at [facility] And [facility] Only)  Result Date: 06/02/2016 CLINICAL DATA:  60 year old female with a history of right-sided weakness and slurred speech. Cardiovascular risk factors include hypertension, prior stroke/ TIA, hyperlipidemia EXAM: BILATERAL CAROTID DUPLEX ULTRASOUND TECHNIQUE: Wallace Cullens scale imaging, color Doppler and duplex ultrasound were performed of bilateral carotid and vertebral arteries in the neck. COMPARISON:  No prior duplex FINDINGS: Criteria: Quantification of carotid stenosis is based on velocity parameters that correlate the residual internal carotid diameter with NASCET-based stenosis levels, using the diameter of the distal internal carotid lumen as the denominator for stenosis measurement. The following velocity measurements were obtained: RIGHT ICA:  Systolic 93 cm/sec, Diastolic 24  cm/sec CCA:  123 cm/sec SYSTOLIC ICA/CCA RATIO:  1.08 ECA:  153 cm/sec LEFT ICA:  Systolic 97 cm/sec, Diastolic 23 cm/sec CCA:  120 cm/sec SYSTOLIC ICA/CCA RATIO:  1.09 ECA:  95 cm/sec Right Brachial SBP: Not acquired Left Brachial SBP: Not acquired RIGHT CAROTID ARTERY: No significant calcified disease of the right common carotid artery. Intermediate waveform maintained. Heterogeneous plaque without significant calcifications at the right carotid bifurcation. Low resistance waveform of the right ICA. No significant tortuosity. RIGHT VERTEBRAL ARTERY: Antegrade flow with low resistance waveform. LEFT CAROTID ARTERY: No significant calcified disease of the left common carotid artery. Intermediate waveform maintained. Heterogeneous plaque at the left carotid bifurcation without significant calcifications. Low resistance waveform of the left ICA. LEFT VERTEBRAL ARTERY:  Antegrade flow with low resistance waveform. IMPRESSION: Color duplex indicates minimal heterogeneous plaque, with no hemodynamically significant stenosis by duplex criteria in the extracranial cerebrovascular circulation. Signed, Yvone Neu. Loreta Ave, DO  [facility] Electronically Signed   By: Gilmer MorJaime  Wagner D.O.   On: 06/02/2016 14:19    Scheduled Meds: .  stroke: mapping our early stages of recovery book   Does not apply Once  . aspirin EC  81 mg Oral q morning - 10a  . atorvastatin  40 mg Oral QPM  . clopidogrel  75 mg Oral Daily  . heparin  5,000 Units Subcutaneous Q8H  . metoprolol tartrate  25 mg Oral BID   Continuous Infusions: . sodium chloride 50 mL/hr at 06/02/16 0639     LOS: 0 days   CHIU, Scheryl MartenSTEPHEN K, MD [facility] Pager [phone]  If 7PM-7AM, please contact night-coverage [email or web address] Password [email or web address] 06/02/2016, 3:52 PM

## 2016-06-02 NOTE — Discharge Summary (Addendum)
Physician Discharge Summary  Claire Hunt ZOX:[id number] DOB: 1956/04/09 DOA: 06/01/2016  PCP: [facility]  Admit date: 06/01/2016 Discharge date: 06/03/16  Admitted From: Home Disposition:  Home  Recommendations for Outpatient Follow-up:  1. Follow up with PCP in 1 week 2. Follow up with Neurology as scheduled  Discharge Condition:Stable CODE STATUS:Full Diet recommendation: Heart healthy   Brief/Interim Summary: 60 y.o.femalewith hx of HLD, HTN, seen in ER 3 days ago for GI symptoms which resolved, patient returned with right sided weakness that was unresolved. Patient lives at home with her husband and her mother, and her mother had GI symptoms. She noted slurred speech started 3 days PTA, and also had right sided weakness. Her sister said she has significant stress at home. She has no visual problem and no HA. Work up in the ER included a head CT which was negative. Hospiatist was asked to admit her for stroke work up. She is out of the TPA window, and she has been compliant with her daily ASA.   1. Acute L pontine CVA 1. Confirmed on MRI brain 2. Radiology studies reviewed. Carotid dopplers without evidence of stenosis 3. 2d echo performed, EF of 60-65% 4. Seen by PT and SLP 5. Concerns for cognitive defects per SLP thus recommendation for home health SLP 6. Patient had been on ASA, plavix was added 7. Have consulted Neurology. Recommendation to increase ASA to 325mg . Discussed with Neurology with recommendations to continue on ASA alone and to d/c plavix at this tim 2. HTN 1. BP stable at present and controlled 2. Continue to monitor 3. HLD 1. Patient on statin  Discharge Diagnoses:  Principal Problem:   Acute cerebrovascular accident (CVA) (HCC) Active Problems:   HTN (hypertension)   HLD (hyperlipidemia)   Stroke (cerebrum) (HCC)   Stroke [facility])    Discharge Instructions     Medication List    STOP taking these medications    hydrochlorothiazide 12.5 MG tablet Commonly known as:  HYDRODIURIL   lisinopril 40 MG tablet Commonly known as:  PRINIVIL,ZESTRIL     TAKE these medications   aspirin 325 MG EC tablet Take 1 tablet (325 mg total) by mouth daily. What changed:  medication strength  how much to take  when to take this   atorvastatin 40 MG tablet Commonly known as:  LIPITOR Take 40 mg by mouth every evening.   metoprolol tartrate 25 MG tablet Commonly known as:  LOPRESSOR Take 25 mg by mouth 2 (two) times daily.   ondansetron 8 MG tablet Commonly known as:  ZOFRAN Take 1 tablet (8 mg total) by mouth every 8 (eight) hours as needed for nausea or vomiting.      Follow-up Information    [facility]. Schedule an appointment as soon as possible for a visit in 1 week(s).   Specialty:  General Practice Contact information: [address] [place] [address] [phone]          No Known Allergies  Consultations:  Neurology  Procedures/Studies: Ct Head Wo Contrast  Result Date: 06/02/2016 CLINICAL DATA:  Right-sided weakness for 3 days EXAM: CT HEAD WITHOUT CONTRAST TECHNIQUE: Contiguous axial images were obtained from the base of the skull through the vertex without intravenous contrast. COMPARISON:  None. FINDINGS: Brain: No mass lesion, intraparenchymal hemorrhage or extra-axial collection. No evidence of acute cortical infarct. Brain parenchyma and CSF-containing spaces are normal for age. Vascular: No hyperdense vessel or unexpected calcification. Skull: Normal visualized skull base, calvarium and  extracranial soft tissues. Sinuses/Orbits: No sinus fluid levels or advanced mucosal thickening. No mastoid effusion. Normal orbits. IMPRESSION: Normal head CT. Electronically Signed   By: Deatra RobinsonKevin  Herman M.D.   On: 06/02/2016 01:55   Mr Shirlee LatchMra Head EAWo Contrast  Result Date: 06/02/2016 CLINICAL DATA:  Weakness on the right side of body with slurred speech since  Monday. EXAM: MRI HEAD WITHOUT CONTRAST MRA HEAD WITHOUT CONTRAST TECHNIQUE: Multiplanar, multiecho pulse sequences of the brain and surrounding structures were obtained without intravenous contrast. Angiographic images of the head were obtained using MRA technique without contrast. COMPARISON:  Head CT from earlier today FINDINGS: MRI HEAD FINDINGS Brain: Moderate area of restricted diffusion left of midline in the pons. No superimposed hemorrhage. Few patchy FLAIR hyperintensities in the cerebral white matter consistent with mild chronic microvascular disease in this patient with vascular risk factors. No hydrocephalus, mass, or shift. Vascular: Arterial findings described below. Normal dural venous sinus flow voids. Skull and upper cervical spine: Negative Sinuses/Orbits: Negative MRA HEAD FINDINGS Symmetric carotid and vertebral arteries. Large right posterior communicating artery. Proximal basilar fenestration. Mild smooth mid basilar narrowing, likely atherosclerotic. Downstream vessels are widely patent. Undulation of the bilateral ICA at the skullbase is likely artifact based on source images. No notable stenosis in the anterior circulation. No branch occlusion or aneurysm. IMPRESSION: 1. Acute, nonhemorrhagic left pontine perforator infarct. 2. Smooth mild narrowing of the mid basilar, presumably atherosclerotic. No acute arterial finding. 3. Mild chronic microvascular disease in the cerebral white matter. Electronically Signed   By: Marnee SpringJonathon  Watts M.D.   On: 06/02/2016 07:48   Mr Brain Wo Contrast  Result Date: 06/02/2016 CLINICAL DATA:  Weakness on the right side of body with slurred speech since Monday. EXAM: MRI HEAD WITHOUT CONTRAST MRA HEAD WITHOUT CONTRAST TECHNIQUE: Multiplanar, multiecho pulse sequences of the brain and surrounding structures were obtained without intravenous contrast. Angiographic images of the head were obtained using MRA technique without contrast. COMPARISON:  Head CT  from earlier today FINDINGS: MRI HEAD FINDINGS Brain: Moderate area of restricted diffusion left of midline in the pons. No superimposed hemorrhage. Few patchy FLAIR hyperintensities in the cerebral white matter consistent with mild chronic microvascular disease in this patient with vascular risk factors. No hydrocephalus, mass, or shift. Vascular: Arterial findings described below. Normal dural venous sinus flow voids. Skull and upper cervical spine: Negative Sinuses/Orbits: Negative MRA HEAD FINDINGS Symmetric carotid and vertebral arteries. Large right posterior communicating artery. Proximal basilar fenestration. Mild smooth mid basilar narrowing, likely atherosclerotic. Downstream vessels are widely patent. Undulation of the bilateral ICA at the skullbase is likely artifact based on source images. No notable stenosis in the anterior circulation. No branch occlusion or aneurysm. IMPRESSION: 1. Acute, nonhemorrhagic left pontine perforator infarct. 2. Smooth mild narrowing of the mid basilar, presumably atherosclerotic. No acute arterial finding. 3. Mild chronic microvascular disease in the cerebral white matter. Electronically Signed   By: Marnee SpringJonathon  Watts M.D.   On: 06/02/2016 07:48   [place] Carotid Bilateral (at [facility] Only)  Result Date: 06/02/2016 CLINICAL DATA:  60 year old female with a history of right-sided weakness and slurred speech. Cardiovascular risk factors include hypertension, prior stroke/ TIA, hyperlipidemia EXAM: BILATERAL CAROTID DUPLEX ULTRASOUND TECHNIQUE: Wallace CullensGray scale imaging, color Doppler and duplex ultrasound were performed of bilateral carotid and vertebral arteries in the neck. COMPARISON:  No prior duplex FINDINGS: Criteria: Quantification of carotid stenosis is based on velocity parameters that correlate the residual internal carotid diameter with NASCET-based stenosis levels, using the diameter  of the distal internal carotid lumen as the denominator for stenosis measurement. The  following velocity measurements were obtained: RIGHT ICA:  Systolic 93 cm/sec, Diastolic 24 cm/sec CCA:  123 cm/sec SYSTOLIC ICA/CCA RATIO:  1.08 ECA:  153 cm/sec LEFT ICA:  Systolic 97 cm/sec, Diastolic 23 cm/sec CCA:  120 cm/sec SYSTOLIC ICA/CCA RATIO:  1.09 ECA:  95 cm/sec Right Brachial SBP: Not acquired Left Brachial SBP: Not acquired RIGHT CAROTID ARTERY: No significant calcified disease of the right common carotid artery. Intermediate waveform maintained. Heterogeneous plaque without significant calcifications at the right carotid bifurcation. Low resistance waveform of the right ICA. No significant tortuosity. RIGHT VERTEBRAL ARTERY: Antegrade flow with low resistance waveform. LEFT CAROTID ARTERY: No significant calcified disease of the left common carotid artery. Intermediate waveform maintained. Heterogeneous plaque at the left carotid bifurcation without significant calcifications. Low resistance waveform of the left ICA. LEFT VERTEBRAL ARTERY:  Antegrade flow with low resistance waveform. IMPRESSION: Color duplex indicates minimal heterogeneous plaque, with no hemodynamically significant stenosis by duplex criteria in the extracranial cerebrovascular circulation. Signed, Yvone NeuJaime S. Loreta AveWagner, DO [facility] Electronically Signed   By: Gilmer MorJaime  Wagner D.O.   On: 06/02/2016 14:19    Subjective: Eager to go home  Discharge Exam: Vitals:   06/03/16 0600 06/03/16 0720  BP: (!) 143/81   Pulse:    Resp: 15 17  Temp:  97.6 F (36.4 C)   Vitals:   06/03/16 0400 06/03/16 0500 06/03/16 0600 06/03/16 0720  BP: 126/62 134/73 (!) 143/81   Pulse:      Resp: 18 15 15 17   Temp: 97.4 F (36.3 C)   97.6 F (36.4 C)  TempSrc: Oral   Oral  SpO2: 96%     Weight:  73.3 kg (161 lb 9.6 oz)    Height:        General: Pt is alert, awake, not in acute distress Cardiovascular: RRR, S1/S2 +, no rubs, no gallops Respiratory: CTA bilaterally, no  wheezing, no rhonchi Abdominal: Soft, NT, ND, bowel sounds + Extremities: no edema, no cyanosis   The results of significant diagnostics from this hospitalization (including imaging, microbiology, ancillary and laboratory) are listed below for reference.     Microbiology: Recent Results (from the past 240 hour(s))  MRSA PCR Screening     Status: None   Collection Time: 06/02/16  4:33 AM  Result Value Ref Range Status   MRSA by PCR NEGATIVE NEGATIVE Final    Comment:        The GeneXpert MRSA Assay (FDA approved for NASAL specimens only), is one component of a comprehensive MRSA colonization surveillance program. It is not intended to diagnose MRSA infection nor to guide or monitor treatment for MRSA infections.      Labs: BNP (last 3 results) No results for input(s): BNP in the last 8760 hours. Basic Metabolic Panel:  Recent Labs Lab 05/30/16 1807 06/02/16 0126  NA 136 137  K 3.6 3.8  CL 103 103  CO2 26 27  GLUCOSE 122* 113*  BUN 27* 20  CREATININE 1.14* 1.28*  CALCIUM 9.8 10.0   Liver Function Tests:  Recent Labs Lab 05/30/16 1807 06/02/16 0126  AST 18 15  ALT 13* 12*  ALKPHOS 90 76  BILITOT 0.7 0.6  PROT 8.1 7.6  ALBUMIN 4.2 4.1    Recent Labs Lab 05/30/16 1807  LIPASE 22   No results for input(s): AMMONIA in the last 168 hours. CBC:  Recent Labs Lab 05/30/16  1807 06/02/16 0126  WBC 11.9* 9.7  NEUTROABS 9.7* 6.2  HGB 11.2* 11.3*  HCT 34.3* 34.6*  MCV 90.0 89.6  PLT 347 357   Cardiac Enzymes:  Recent Labs Lab 06/02/16 0126  TROPONINI <0.03   BNP: Invalid input(s): POCBNP CBG: No results for input(s): GLUCAP in the last 168 hours. D-Dimer No results for input(s): DDIMER in the last 72 hours. Hgb A1c  Recent Labs  06/02/16 0126  HGBA1C 6.2*   Lipid Profile  Recent Labs  06/02/16 0126  CHOL 224*  HDL 54  LDLCALC 146*  TRIG 119  CHOLHDL 4.1   Thyroid function studies No results for input(s): TSH, T4TOTAL,  T3FREE, THYROIDAB in the last 72 hours.  Invalid input(s): FREET3 Anemia work up No results for input(s): VITAMINB12, FOLATE, FERRITIN, TIBC, IRON, RETICCTPCT in the last 72 hours. Urinalysis    Component Value Date/Time   COLORURINE YELLOW 06/02/2016 0056   APPEARANCEUR CLEAR 06/02/2016 0056   LABSPEC 1.020 06/02/2016 0056   PHURINE 5.5 06/02/2016 0056   GLUCOSEU NEGATIVE 06/02/2016 0056   HGBUR TRACE (A) 06/02/2016 0056   BILIRUBINUR NEGATIVE 06/02/2016 0056   KETONESUR NEGATIVE 06/02/2016 0056   PROTEINUR NEGATIVE 06/02/2016 0056   NITRITE NEGATIVE 06/02/2016 0056   LEUKOCYTESUR TRACE (A) 06/02/2016 0056   Sepsis Labs Invalid input(s): PROCALCITONIN,  WBC,  LACTICIDVEN Microbiology Recent Results (from the past 240 hour(s))  MRSA PCR Screening     Status: None   Collection Time: 06/02/16  4:33 AM  Result Value Ref Range Status   MRSA by PCR NEGATIVE NEGATIVE Final    Comment:        The GeneXpert MRSA Assay (FDA approved for NASAL specimens only), is one component of a comprehensive MRSA colonization surveillance program. It is not intended to diagnose MRSA infection nor to guide or monitor treatment for MRSA infections.      SIGNED:   Jerald Kief, MD  [facility] 06/03/2016, 8:47 AM  If 7PM-7AM, please contact night-coverage [email or web address] Password [email or web address]

## 2016-06-02 NOTE — Evaluation (Signed)
Physical Therapy Evaluation Patient Details Name: Antony SalmonRebecca Kem MRN: [id number] DOB: 1956-01-06 Today's Date: 06/02/2016   History of Present Illness  60 y.o. female with hx of HLD, HTN, seen in ER 3 days ago for GI symptoms with resolved, returned today as she has right sided weakness unresolved.   She lives at home with her husband and her mother, and her mother had GI symptoms.  She noted slurred speech started 3 days PTA, and also had right sided weakness.  Her sister said she has significant stress at home.  She has no visual problem and no HA.  Work up in the ER included a head CT which was negative.  Hospiatist was asked to admit her for stroke work up.  MRI (+) for left pontine perforator infarct.    Clinical Impression  Pt received sitting up in the chair, and was agreeable to PT evaluation.  Pt expressed that she was independent with ambulation, ADL's, IADL's, still driving and working prior to admission.  During PT evaluation, she demonstrates some mild R LE weakness, however no balance or mobility deficits as a result.  Pt was able to ambulate 83010ft independently at a gait speed of 4.455ft/sec which is normal for her age.  Pt does not demonstrate any PT needs at this time, and therefore, will sign off.     Follow Up Recommendations No PT follow up    Equipment Recommendations  None recommended by PT    Recommendations for Other Services       Precautions / Restrictions Precautions Precautions: None Restrictions Weight Bearing Restrictions: No      Mobility  Bed Mobility Overal bed mobility: Modified Independent                Transfers Overall transfer level: Modified independent Equipment used: None                Ambulation/Gait Ambulation/Gait assistance: Independent Ambulation Distance (Feet): 810 Feet Assistive device: None Gait Pattern/deviations: WFL(Within Functional Limits) Gait velocity: 4.165ft/sec Gait velocity interpretation: at or above  normal speed for age/gender General Gait Details: 3MWT performed  with normal gait speed.   Stairs            Wheelchair Mobility    Modified Rankin (Stroke Patients Only)       Balance Overall balance assessment: No apparent balance deficits (not formally assessed)                                           Pertinent Vitals/Pain Pain Assessment: No/denies pain    Home Living Family/patient expects to be discharged to:: Private residence Living Arrangements: Spouse/significant other;Parent (mother) Available Help at Discharge: Family;Available PRN/intermittently Type of Home: House Home Access: Level entry     Home Layout: One level (2 steps to the kitchen) Home Equipment: Shower seat;Grab bars - tub/shower (Pt states they have a RW in storage from when her husband had a stroke. )      Prior Function Level of Independence: Independent      ADL's / Homemaking Assistance Needed: still driving, community ambulator.    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  Pt assists with groceries and driving family to appointments.  Hand Dominance   Dominant Hand: Right    Extremity/Trunk Assessment   Upper Extremity Assessment: RUE deficits/detail RUE Deficits / Details: RUE strength 4-/5 throughout         Lower Extremity Assessment: RLE deficits/detail RLE Deficits / Details: hip flexor 4/5, and knee extension 4+/5    Cervical / Trunk Assessment: Normal  Communication   Communication: No difficulties  Cognition Arousal/Alertness: Awake/alert Behavior During Therapy: WFL for tasks assessed/performed Overall Cognitive Status: Within Functional Limits for tasks assessed                      General Comments      Exercises     Assessment/Plan    PT Assessment Patent does not  need any further PT services  PT Problem List            PT Treatment Interventions      PT Goals (Current goals can be found in the Care Plan section)  Acute Rehab PT Goals PT Goal Formulation: All assessment and education complete, DC therapy    Frequency     Barriers to discharge        Co-evaluation               End of Session Equipment Utilized During Treatment: Gait belt Activity Tolerance: Patient tolerated treatment well Patient left: in chair;with call bell/phone within reach Nurse Communication: Mobility status Benedetto Goad(Leighann, RN aware of pt's mobiltiy status. )    Functional Assessment Tool Used: [employer] AM-PAC "6-clicks" and 3MWT Functional Limitation: Mobility: Walking and moving around Mobility: Walking and Moving Around Current Status [phone](G8978): 0 percent impaired, limited or restricted Mobility: Walking and Moving Around Goal Status [phone](G8979): 0 percent impaired, limited or restricted Mobility: Walking and Moving Around Discharge Status [phone](G8980): 0 percent impaired, limited or restricted    Time: [id number] PT Time Calculation (min) (ACUTE ONLY): 20 min   Charges:   PT Evaluation $PT Eval Low Complexity: 1 Procedure     PT G Codes:   PT G-Codes **NOT FOR INPATIENT CLASS** Functional Assessment Tool Used: [employer] AM-PAC "6-clicks" and 3MWT Functional Limitation: Mobility: Walking and moving around Mobility: Walking and Moving Around Current Status [phone](G8978): 0 percent impaired, limited or restricted Mobility: Walking and Moving Around Goal Status [phone](G8979): 0 percent impaired, limited or restricted Mobility: Walking and Moving Around Discharge Status [phone](G8980): 0 percent impaired, limited or restricted    Beth Arhianna Ebey, PT, DPT X: [phone]

## 2016-06-02 NOTE — Consult Note (Signed)
[place] A. Merlene Laughter, MD     [email or web address]          Claire Hunt is an 60 y.o. female.   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 This is most likely due to a perforating blood vessel. Risk factors includes age, hypertension and hypercholesterolemia. The patient's aspirin will be increased from 81 mg to 325 mg. We will continue with blood pressure control and lipid control. She has been seen by occupational and physical therapy.   The patient is a 60 year old black female who was seen at the emergency room by 4 days ago for weakness and GI symptoms of nausea vomiting. She was evaluated and released. She presented with right hemiparesis yesterday. The patient's right-sided weakness may have been there on initial presentation but is not quite certain at this time. She does report having some dysarthria today. She denies headaches, dizziness or any GI symptoms at this time. It appears that she has symptoms have resolved. There is no chest pain or shortness of breath. The review of systems otherwise negative.     GENERAL: This a pleasant female in no acute distress. She appears about 54 years older than the stated age.  HEENT: Supple. Atraumatic normocephalic.   ABDOMEN: soft  EXTREMITIES: No edema   BACK: Normal.  SKIN: Normal by inspection.    MENTAL STATUS: Alert and oriented. Speech is mildly dysarthric, language and cognition are generally intact. Judgment and insight normal.   CRANIAL NERVES: Pupils are equal, round and reactive to light and accommodation; extra ocular movements are full, there is no significant nystagmus; visual fields are full; upper and lower facial muscles are normal in strength and symmetric, there is no flattening of the nasolabial folds; tongue is midline; uvula is midline; shoulder elevation is normal.  MOTOR: Normal tone, bulk  and strength L; LUE pronator drift; RLL drift; RUE 4/5 and RLE 4/5  COORDINATION: Left finger to nose is normal, right finger to nose is normal, No rest tremor; no intention tremor; no postural tremor; no bradykinesia.  REFLEXES: Deep tendon reflexes are symmetrical and normal.   SENSATION: Normal to light touch.   NIHSS 3.    Blood pressure 126/75, pulse 75, temperature 97.8 F (36.6 C), temperature source Oral, resp. rate 20, height '5\' 1"'$  (1.549 m), weight 161 lb 6 oz (73.2 kg), SpO2 99 %.  Past Medical History:  Diagnosis Date  . Hypercholesteremia   . Hypertension     Past Surgical History:  Procedure Laterality Date  . BREAST LUMPECTOMY      No family history on file.  Social History:  reports that she has never smoked. She has never used smokeless tobacco. She reports that she does not drink alcohol or use drugs.  Allergies: No Known Allergies  Medications: Prior to Admission medications   Medication Sig Start Date End Date Taking? Authorizing Provider  aspirin EC 81 MG tablet Take 81 mg by mouth every morning.   Yes Historical Provider, MD  atorvastatin (LIPITOR) 40 MG tablet Take 40 mg by mouth every evening.    Yes Historical Provider, MD  hydrochlorothiazide (HYDRODIURIL) 12.5 MG tablet Take 12.5 mg by mouth daily.   Yes Historical Provider, MD  lisinopril (PRINIVIL,ZESTRIL) 40 MG tablet Take 40 mg by mouth daily.   Yes Historical Provider, MD  metoprolol tartrate (LOPRESSOR) 25 MG tablet Take 25 mg by mouth 2 (two) times daily.   Yes Historical  Provider, MD  clopidogrel (PLAVIX) 75 MG tablet Take 1 tablet (75 mg total) by mouth daily. 06/03/16   Donne Hazel, MD  ondansetron (ZOFRAN) 8 MG tablet Take 1 tablet (8 mg total) by mouth every 8 (eight) hours as needed for nausea or vomiting. Patient not taking: Reported on 06/02/2016 05/30/16   Daleen Bo, MD    Scheduled Meds: .  stroke: mapping our early stages of recovery book   Does not apply Once  .  aspirin EC  81 mg Oral q morning - 10a  . atorvastatin  40 mg Oral QPM  . clopidogrel  75 mg Oral Daily  . heparin  5,000 Units Subcutaneous Q8H  . metoprolol tartrate  25 mg Oral BID   Continuous Infusions: . sodium chloride 50 mL/hr at 06/02/16 0639   PRN Meds:.senna-docusate     Results for orders placed or performed during the hospital encounter of 06/01/16 (from the past 48 hour(s))  Urine rapid drug screen (hosp performed)not at [facility]     Status: None   Collection Time: 06/02/16 12:56 AM  Result Value Ref Range   Opiates NONE DETECTED NONE DETECTED   Cocaine NONE DETECTED NONE DETECTED   Benzodiazepines NONE DETECTED NONE DETECTED   Amphetamines NONE DETECTED NONE DETECTED   Tetrahydrocannabinol NONE DETECTED NONE DETECTED   Barbiturates NONE DETECTED NONE DETECTED    Comment:        DRUG SCREEN FOR MEDICAL PURPOSES ONLY.  IF CONFIRMATION IS NEEDED FOR ANY PURPOSE, NOTIFY LAB WITHIN 5 DAYS.        LOWEST DETECTABLE LIMITS FOR URINE DRUG SCREEN Drug Class       Cutoff (ng/mL) Amphetamine      1000 Barbiturate      200 Benzodiazepine   119 Tricyclics       147 Opiates          300 Cocaine          300 THC              50   Urinalysis, Routine w reflex microscopic (not at [facility])     Status: Abnormal   Collection Time: 06/02/16 12:56 AM  Result Value Ref Range   Color, Urine YELLOW YELLOW   APPearance CLEAR CLEAR   Specific Gravity, Urine 1.020 1.005 - 1.030   pH 5.5 5.0 - 8.0   Glucose, UA NEGATIVE NEGATIVE mg/dL   Hgb urine dipstick TRACE (A) NEGATIVE   Bilirubin Urine NEGATIVE NEGATIVE   Ketones, ur NEGATIVE NEGATIVE mg/dL   Protein, ur NEGATIVE NEGATIVE mg/dL   Nitrite NEGATIVE NEGATIVE   Leukocytes, UA TRACE (A) NEGATIVE  Urine microscopic-add on     Status: Abnormal   Collection Time: 06/02/16 12:56 AM  Result Value Ref Range   Squamous Epithelial / LPF 0-5 (A) NONE SEEN   WBC, UA 0-5 0 - 5 WBC/hpf   RBC / HPF 0-5 0 - 5 RBC/hpf   Bacteria, UA FEW (A)  NONE SEEN  Ethanol     Status: None   Collection Time: 06/02/16  1:26 AM  Result Value Ref Range   Alcohol, Ethyl (B) <5 <5 mg/dL    Comment:        LOWEST DETECTABLE LIMIT FOR SERUM ALCOHOL IS 5 mg/dL FOR MEDICAL PURPOSES ONLY   Protime-INR     Status: None   Collection Time: 06/02/16  1:26 AM  Result Value Ref Range   Prothrombin Time 12.8 11.4 - 15.2 seconds   INR 0.96  APTT     Status: None   Collection Time: 06/02/16  1:26 AM  Result Value Ref Range   aPTT 35 24 - 36 seconds  CBC     Status: Abnormal   Collection Time: 06/02/16  1:26 AM  Result Value Ref Range   WBC 9.7 4.0 - 10.5 K/uL   RBC 3.86 (L) 3.87 - 5.11 MIL/uL   Hemoglobin 11.3 (L) 12.0 - 15.0 g/dL   HCT 34.6 (L) 36.0 - 46.0 %   MCV 89.6 78.0 - 100.0 fL   MCH 29.3 26.0 - 34.0 pg   MCHC 32.7 30.0 - 36.0 g/dL   RDW 12.8 11.5 - 15.5 %   Platelets 357 150 - 400 K/uL  Differential     Status: None   Collection Time: 06/02/16  1:26 AM  Result Value Ref Range   Neutrophils Relative % 64 %   Neutro Abs 6.2 1.7 - 7.7 K/uL   Lymphocytes Relative 30 %   Lymphs Abs 2.9 0.7 - 4.0 K/uL   Monocytes Relative 4 %   Monocytes Absolute 0.4 0.1 - 1.0 K/uL   Eosinophils Relative 2 %   Eosinophils Absolute 0.2 0.0 - 0.7 K/uL   Basophils Relative 0 %   Basophils Absolute 0.0 0.0 - 0.1 K/uL  Comprehensive metabolic panel     Status: Abnormal   Collection Time: 06/02/16  1:26 AM  Result Value Ref Range   Sodium 137 135 - 145 mmol/L   Potassium 3.8 3.5 - 5.1 mmol/L   Chloride 103 101 - 111 mmol/L   CO2 27 22 - 32 mmol/L   Glucose, Bld 113 (H) 65 - 99 mg/dL   BUN 20 6 - 20 mg/dL   Creatinine, Ser 1.28 (H) 0.44 - 1.00 mg/dL   Calcium 10.0 8.9 - 10.3 mg/dL   Total Protein 7.6 6.5 - 8.1 g/dL   Albumin 4.1 3.5 - 5.0 g/dL   AST 15 15 - 41 U/L   ALT 12 (L) 14 - 54 U/L   Alkaline Phosphatase 76 38 - 126 U/L   Total Bilirubin 0.6 0.3 - 1.2 mg/dL   GFR calc non Af Amer 45 (L) >60 mL/min   GFR calc Af Amer 52 (L) >60 mL/min     Comment: (NOTE) The eGFR has been calculated using the CKD EPI equation. This calculation has not been validated in all clinical situations. eGFR's persistently <60 mL/min signify possible Chronic Kidney Disease.    Anion gap 7 5 - 15  Troponin I     Status: None   Collection Time: 06/02/16  1:26 AM  Result Value Ref Range   Troponin I <0.03 <0.03 ng/mL  Lipid panel     Status: Abnormal   Collection Time: 06/02/16  1:26 AM  Result Value Ref Range   Cholesterol 224 (H) 0 - 200 mg/dL   Triglycerides 119 <150 mg/dL   HDL 54 >40 mg/dL   Total CHOL/HDL Ratio 4.1 RATIO   VLDL 24 0 - 40 mg/dL   LDL Cholesterol 146 (H) 0 - 99 mg/dL    Comment:        Total Cholesterol/HDL:CHD Risk Coronary Heart Disease Risk Table                     Men   Women  1/2 Average Risk   3.4   3.3  Average Risk       5.0   4.4  2 X Average Risk   9.6  7.1  3 X Average Risk  23.4   11.0        Use the calculated Patient Ratio above and the CHD Risk Table to determine the patient's CHD Risk.        ATP III CLASSIFICATION (LDL):  <100     mg/dL   Optimal  100-129  mg/dL   Near or Above                    Optimal  130-159  mg/dL   Borderline  160-189  mg/dL   High  >190     mg/dL   Very High   MRSA PCR Screening     Status: None   Collection Time: 06/02/16  4:33 AM  Result Value Ref Range   MRSA by PCR NEGATIVE NEGATIVE    Comment:        The GeneXpert MRSA Assay (FDA approved for NASAL specimens only), is one component of a comprehensive MRSA colonization surveillance program. It is not intended to diagnose MRSA infection nor to guide or monitor treatment for MRSA infections.     Studies/Results:  BRAIN MRI MRA FINDINGS: MRI HEAD FINDINGS  Brain: Moderate area of restricted diffusion left of midline in the pons. No superimposed hemorrhage. Few patchy FLAIR hyperintensities in the cerebral white matter consistent with mild chronic microvascular disease in this patient with vascular  risk factors. No hydrocephalus, mass, or shift.  Vascular: Arterial findings described below. Normal dural venous sinus flow voids.  Skull and upper cervical spine: Negative  Sinuses/Orbits: Negative  MRA HEAD FINDINGS  Symmetric carotid and vertebral arteries. Large right posterior communicating artery. Proximal basilar fenestration.  Mild smooth mid basilar narrowing, likely atherosclerotic. Downstream vessels are widely patent. Undulation of the bilateral ICA at the skullbase is likely artifact based on source images. No notable stenosis in the anterior circulation. No branch occlusion or aneurysm.  IMPRESSION: 1. Acute, nonhemorrhagic left pontine perforator infarct. 2. Smooth mild narrowing of the mid basilar, presumably atherosclerotic. No acute arterial finding. 3. Mild chronic microvascular disease in the cerebral white matter.     The brain MRI and MRA are reviewed in person. There is increased signal seen on diffusion imaging involving the left pontine tegmentum consistent with a penetrating perforating infarct. The basilar artery looks smoothly fine although at the mid level there is some mild luminal irregularities. All the other blood vessels are fine.    TTE - Left ventricle: The cavity size was normal. Wall thickness was   increased in a pattern of mild LVH. Systolic function was normal.   The estimated ejection fraction was in the range of 60% to 65%.   Left ventricular diastolic function parameters were normal.    CAROTID DOPPLERS unremarkable.    Malahki Gasaway A. Merlene Laughter, M.D.  Diplomate, Tax adviser of Psychiatry and Neurology ( Neurology). 06/02/2016, 7:47 PM

## 2016-06-02 NOTE — Care Management Note (Signed)
Case Management Note  Patient Details  Name: Claire SalmonRebecca Hunt MRN: [id number] Date of Birth: 1956/05/30  Subjective/Objective:                  Pt admitted with CVA. Pt is from home, ind with ADL's and has family for support. [facility] She goes [facility] for PCP care. They provide assistance with medications. PT recommends no PT follow up. Pt plans to return home with self care. Pt should not need assistance with cost of plavix.   Action/Plan: No CM needs anticipated.   Expected Discharge Date:                  Expected Discharge Plan:  Home/Self Care  In-House Referral:  NA  Discharge planning Services  CM Consult  Post Acute Care Choice:  NA Choice offered to:  NA  Status of Service:  Completed, signed off  Malcolm MetroChildress, Ashlinn Hemrick Demske, RN 06/02/2016, 1:55 PM

## 2016-06-02 NOTE — ED Provider Notes (Signed)
[facility]-EMERGENCY DEPT Provider Note   CSN: [id number] Arrival date & time: 06/01/16  2334  By signing my name below, I, Alyssa GroveMartin Green, attest that this documentation has been prepared under the direction and in the presence of Zadie Rhineonald Aleeha Boline, MD. Electronically Signed: Alyssa GroveMartin Green, ED Scribe. 06/02/16. 12:57 AM.   History   Chief Complaint Chief Complaint  Patient presents with  . Weakness   The history is provided by the patient. No language interpreter was used.  Weakness  Primary symptoms include speech change. This is a new problem. The current episode started more than 2 days ago. The problem has been gradually worsening. There was right lower extremity and right facial focality noted. There has been no fever. Associated symptoms include vomiting. Pertinent negatives include no shortness of breath, no chest pain and no headaches.   HPI Comments: Claire SalmonRebecca Hunt is a 60 y.o. female who presents to the Emergency Department complaining of gradual onset, constant right sided weakness onset 4 days. Pt reports associated fever, nausea, and slurred speech. She was seen in ED on 11/7 for similar symptoms, but reports that her weakness has worsened today. She denies headache, vision problems, chest pain shortness of breath, abdominal pain, syncope, numbness, neck pain, back pain.  Past Medical History:  Diagnosis Date  . Hypercholesteremia   . Hypertension     There are no active problems to display for this patient.   Past Surgical History:  Procedure Laterality Date  . BREAST LUMPECTOMY      OB History    No data available      Home Medications    Prior to Admission medications   Medication Sig Start Date End Date Taking? Authorizing Provider  aspirin EC 81 MG tablet Take 81 mg by mouth every morning.   Yes Historical Provider, MD  atorvastatin (LIPITOR) 40 MG tablet Take 40 mg by mouth every evening.    Yes Historical Provider, MD  hydrochlorothiazide (HYDRODIURIL) 12.5 MG  tablet Take 12.5 mg by mouth daily.   Yes Historical Provider, MD  lisinopril (PRINIVIL,ZESTRIL) 40 MG tablet Take 40 mg by mouth daily.   Yes Historical Provider, MD  metoprolol tartrate (LOPRESSOR) 25 MG tablet Take 25 mg by mouth 2 (two) times daily.   Yes Historical Provider, MD  ondansetron (ZOFRAN) 8 MG tablet Take 1 tablet (8 mg total) by mouth every 8 (eight) hours as needed for nausea or vomiting. 05/30/16   Mancel BaleElliott Wentz, MD   Family History No family history on file.  Social History Social History  Substance Use Topics  . Smoking status: Never Smoker  . Smokeless tobacco: Never Used  . Alcohol use No    Allergies   Patient has no known allergies.  Review of Systems Review of Systems  Constitutional: Positive for fever.  Eyes: Negative for visual disturbance.  Respiratory: Negative for shortness of breath.   Cardiovascular: Negative for chest pain.  Gastrointestinal: Positive for nausea and vomiting. Negative for abdominal pain.  Musculoskeletal: Negative for back pain and neck pain.  Neurological: Positive for speech change, speech difficulty and weakness. Negative for syncope, numbness and headaches.  All other systems reviewed and are negative.  Physical Exam Updated Vital Signs BP 134/76   Pulse (!) 54   Temp 97.9 F (36.6 C)   Resp 19   Ht 5\' 1"  (1.549 m)   Wt 155 lb (70.3 kg)   SpO2 98%   BMI 29.29 kg/m   Physical Exam CONSTITUTIONAL: Well developed/well nourished HEAD: Normocephalic/atraumatic EYES:  EOMI/PERRL ENMT: Mucous membranes moist NECK: supple no meningeal signs, no bruits SPINE/BACK:entire spine nontender CV: S1/S2 noted, no murmurs/rubs/gallops noted LUNGS: Lungs are clear to auscultation bilaterally, no apparent distress ABDOMEN: soft, nontender, no rebound or guarding, bowel sounds noted throughout abdomen GU:no cva tenderness NEURO: Pt is awake/alert/appropriate, mild right facial droop, right leg drift noted, no sensory deficit  noted.  No weakness noted to left arm/leg EXTREMITIES: pulses normal/equal, full ROM SKIN: warm, color normal PSYCH: no abnormalities of mood noted, alert and oriented to situation   ED Treatments / Results  DIAGNOSTIC STUDIES: Oxygen Saturation is 98% on RA, normal by my interpretation.    COORDINATION OF CARE: 12:54 AM Discussed treatment plan with pt at bedside which includes CT Head, lab work and admission and pt agreed to plan.  Labs (all labs ordered are listed, but only abnormal results are displayed) Labs Reviewed  CBC - Abnormal; Notable for the following:       Result Value   RBC 3.86 (*)    Hemoglobin 11.3 (*)    HCT 34.6 (*)    All other components within normal limits  COMPREHENSIVE METABOLIC PANEL - Abnormal; Notable for the following:    Glucose, Bld 113 (*)    Creatinine, Ser 1.28 (*)    ALT 12 (*)    GFR calc non Af Amer 45 (*)    GFR calc Af Amer 52 (*)    All other components within normal limits  MRSA PCR SCREENING  ETHANOL  PROTIME-INR  APTT  DIFFERENTIAL  TROPONIN I  RAPID URINE DRUG SCREEN, HOSP PERFORMED  URINALYSIS, ROUTINE W REFLEX MICROSCOPIC (NOT AT [facility])  HEMOGLOBIN A1C  LIPID PANEL  CBC  CREATININE, SERUM    EKG  EKG Interpretation None       Radiology Ct Head Wo Contrast  Result Date: 06/02/2016 CLINICAL DATA:  Right-sided weakness for 3 days EXAM: CT HEAD WITHOUT CONTRAST TECHNIQUE: Contiguous axial images were obtained from the base of the skull through the vertex without intravenous contrast. COMPARISON:  None. FINDINGS: Brain: No mass lesion, intraparenchymal hemorrhage or extra-axial collection. No evidence of acute cortical infarct. Brain parenchyma and CSF-containing spaces are normal for age. Vascular: No hyperdense vessel or unexpected calcification. Skull: Normal visualized skull base, calvarium and extracranial soft tissues. Sinuses/Orbits: No sinus fluid levels or advanced mucosal thickening. No mastoid effusion. Normal  orbits. IMPRESSION: Normal head CT. Electronically Signed   By: Deatra Robinson M.D.   On: 06/02/2016 01:55    Procedures Procedures (including critical care time)  Medications Ordered in ED   Initial Impression / Assessment and Plan / ED Course  I have reviewed the triage vital signs and the nursing notes.  Pertinent labs  results that were available during my care of the patient were reviewed by me and considered in my medical decision making (see chart for details).  Clinical Course    I personally performed the services described in this documentation, which was scribed in my presence. The recorded information has been reviewed and is accurate.      tPA in stroke considered but not given due to: Onset over 3-4.5hours    Pt in the ED for weakness for several days She was seen in ED on 11/7, but main issue at that time appeared to be vomiting/diarrhea.   She has mild weakness on this visits Will admit for stroke workup D/w dr Houston Siren for admission   Final Clinical Impressions(s) / ED Diagnoses   Final diagnoses:  Cerebrovascular accident (CVA), unspecified mechanism (HCC)    New Prescriptions New Prescriptions   No medications on file     Zadie Rhineonald Noheli Melder, MD 06/02/16 [phone]

## 2016-06-02 NOTE — Progress Notes (Signed)
*  PRELIMINARY RESULTS* Echocardiogram 2D Echocardiogram has been performed.  Jeryl Columbialliott, Won Kreuzer 06/02/2016, 11:13 AM

## 2016-06-03 LAB — HEMOGLOBIN A1C
HEMOGLOBIN A1C: 6.2 % — AB (ref 4.8–5.6)
MEAN PLASMA GLUCOSE: 131 mg/dL

## 2016-06-03 MED ORDER — ASPIRIN 325 MG PO TBEC: 325.0000 mg | DELAYED_RELEASE_TABLET | Freq: Every day | ORAL | 0 refills | Status: AC

## 2016-06-03 NOTE — Progress Notes (Signed)
Patient alert and oriented. Vital signs stable. Saline lock removed. Discharge instructions given. Prescriptions given. Patient verbalized understanding of instructions. Patient left floor via wheelchair with nursing staff and family member.

## 2016-06-03 NOTE — Progress Notes (Addendum)
CM received [facility] and [facility].  Patient to have SLP services.  Patient uninsured  somone to contact patient regarding charity care for services for home care.  CM spoke with patient and she is aware someone will contact [facility] Monday [facility]

## 2018-04-16 IMAGING — MR MR MRA HEAD W/O CM
1 series · 16 of 48 positions shown · non-contrast
Comparison: Head CT from earlier today

CLINICAL DATA: Weakness on the right side of body with slurred
speech since [REDACTED].

EXAM:
MRI HEAD WITHOUT CONTRAST
MRA HEAD WITHOUT CONTRAST
TECHNIQUE: Multiplanar, multiecho pulse sequences of the brain and surrounding
structures were obtained without intravenous contrast. Angiographic
images of the head were obtained using MRA technique without
contrast.

[Series 1: MRA · axial · 0.8mm · 0.32mm/px · z∈[-93,+4]mm · 16 of 131 slices shown]
[im 1/131]
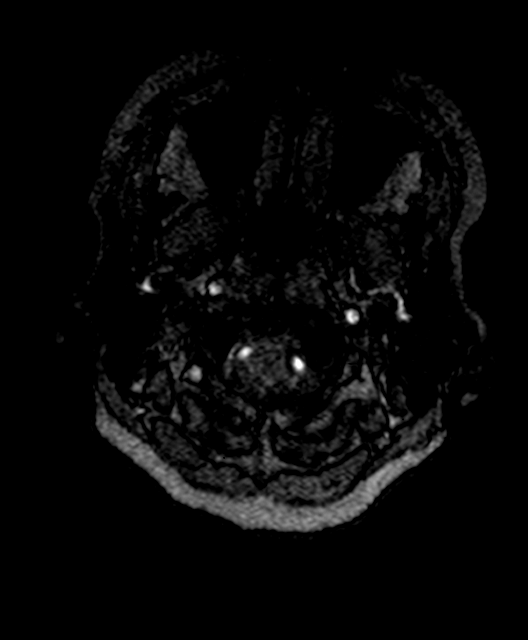
[im 3/131]
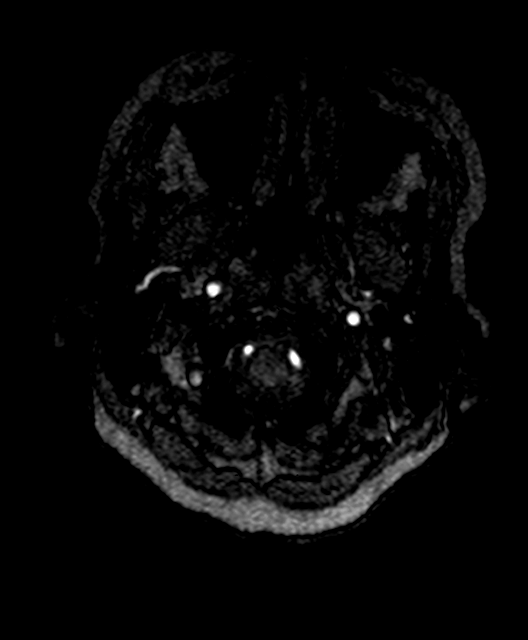
[im 6/131]
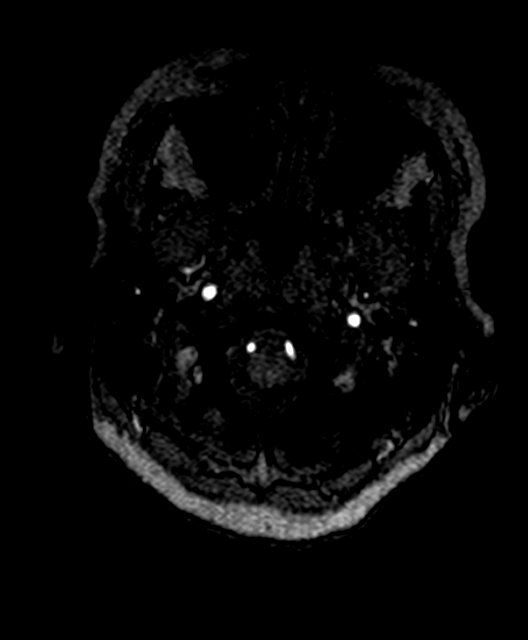
[im 9/131]
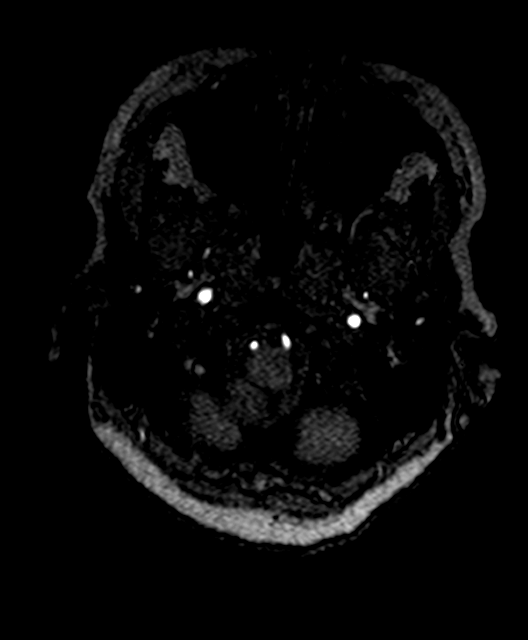
[im 12/131]
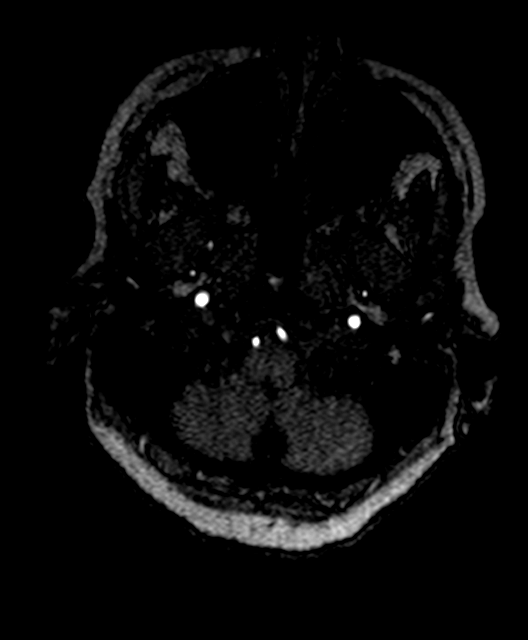
[im 14/131]
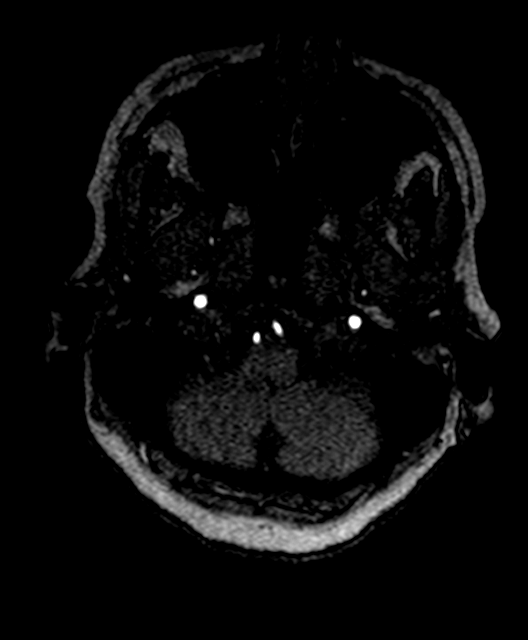
[im 23/131]
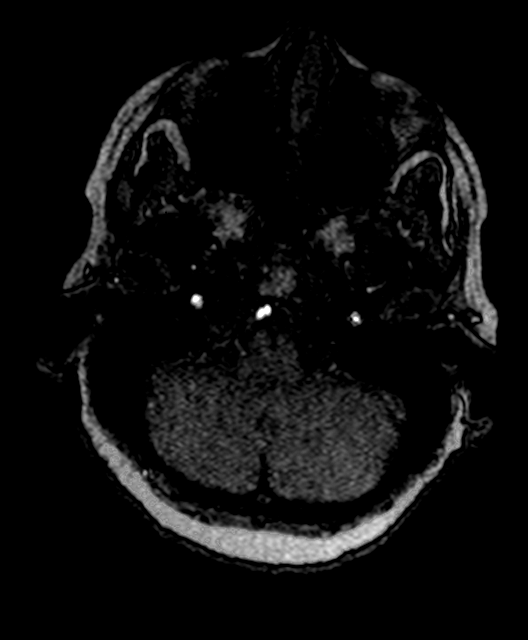
[im 25/131]
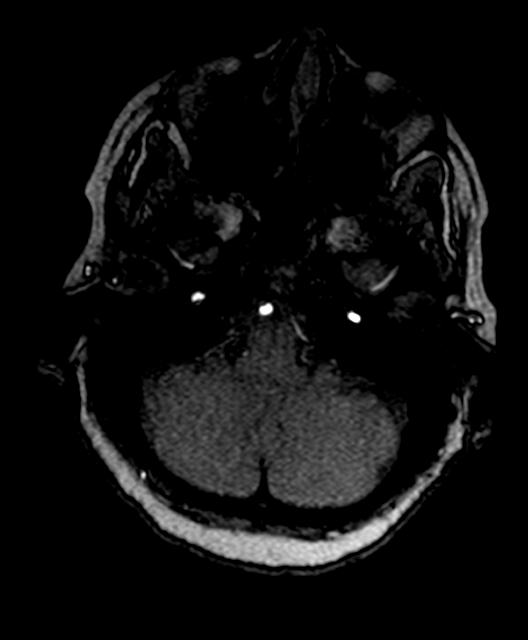
[im 42/131]
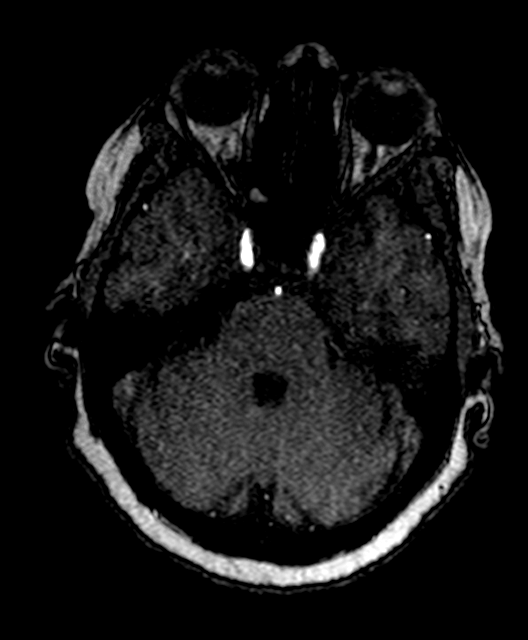
[im 59/131]
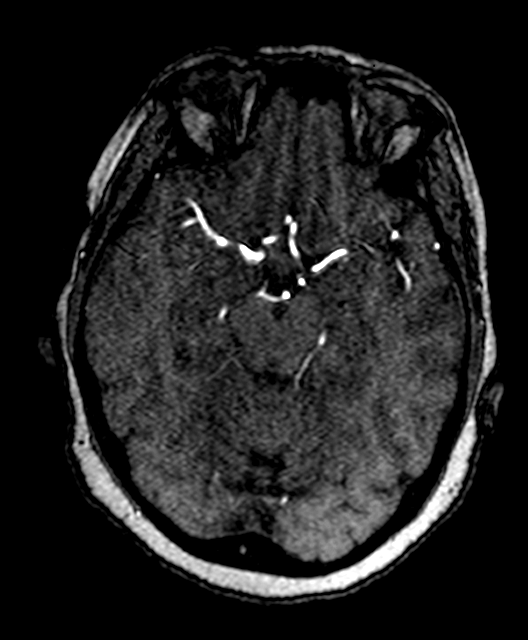
[im 67/131]
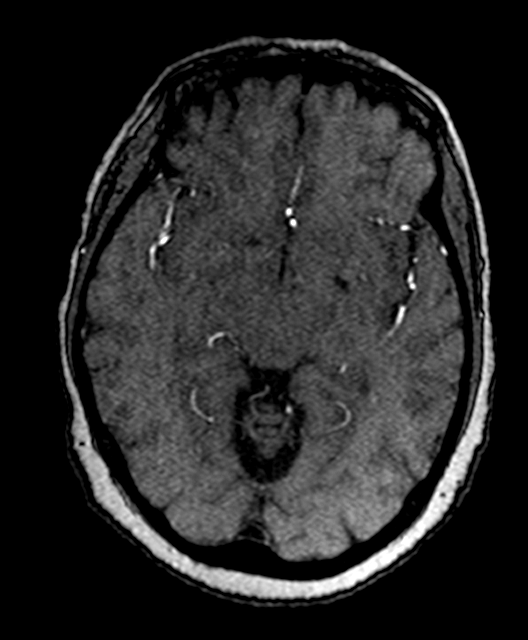
[im 75/131]
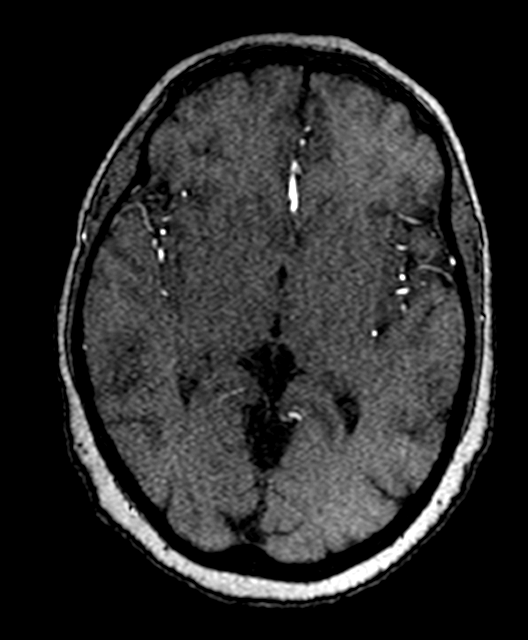
[im 92/131]
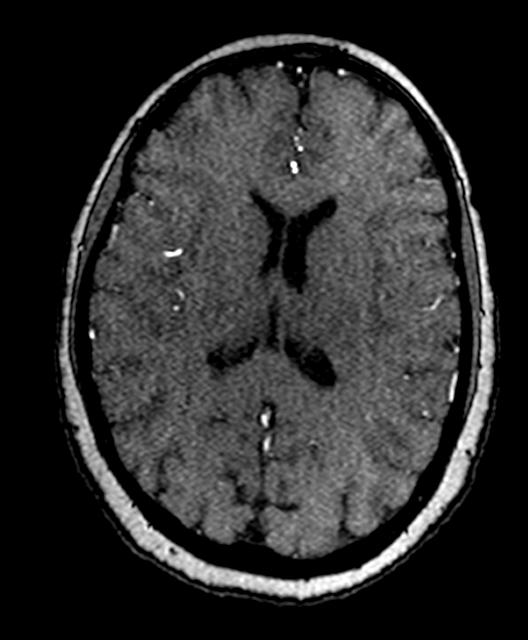
[im 108/131]
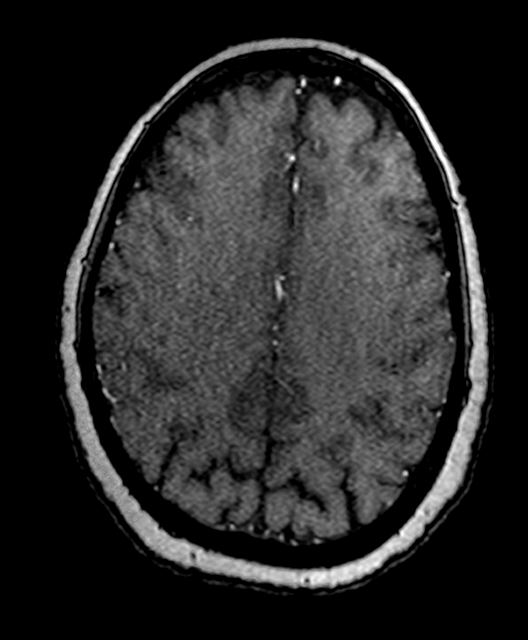
[im 111/131]
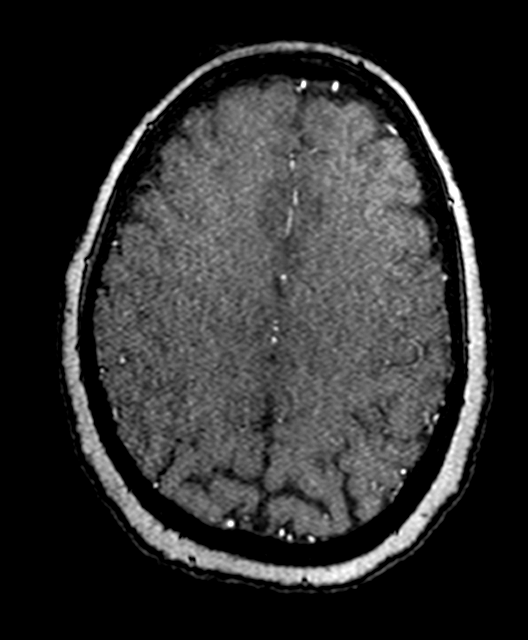
[im 125/131]
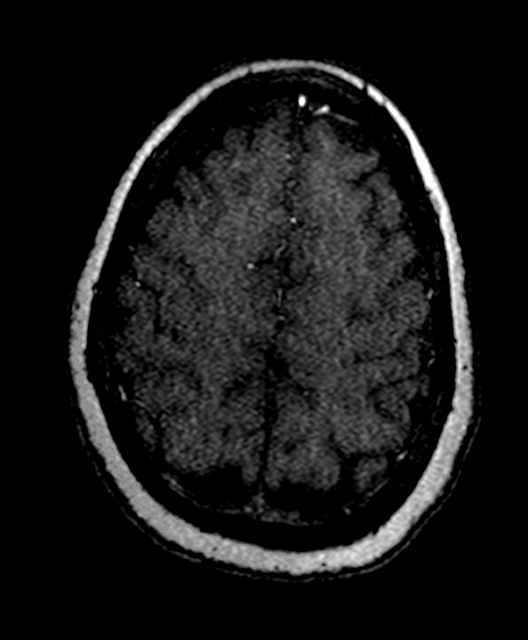

[16 of 48 positions shown; findings below may reference images not displayed]

FINDINGS: MRI HEAD FINDINGS

Brain: Moderate area of restricted diffusion left of midline in the
pons. No superimposed hemorrhage. Few patchy FLAIR hyperintensities
in the cerebral white matter consistent with mild chronic
microvascular disease in this patient with vascular risk factors. No
hydrocephalus, mass, or shift.

Vascular: Arterial findings described below. Normal dural venous
sinus flow voids.

Skull and upper cervical spine: Negative

Sinuses/Orbits: Negative

MRA HEAD FINDINGS

Symmetric carotid and vertebral arteries. Large right posterior
communicating artery. Proximal basilar fenestration.

Mild smooth mid basilar narrowing, likely atherosclerotic.
Downstream vessels are widely patent. Undulation of the bilateral
ICA at the skullbase is likely artifact based on source images. No
notable stenosis in the anterior circulation. No branch occlusion or
aneurysm.
IMPRESSION: 1. Acute, nonhemorrhagic left pontine perforator infarct.
2. Smooth mild narrowing of the mid basilar, presumably
atherosclerotic. No acute arterial finding.
3. Mild chronic microvascular disease in the cerebral white matter.

## 2021-11-19 ENCOUNTER — Other Ambulatory Visit: Payer: Self-pay

## 2021-11-19 ENCOUNTER — Emergency Department (HOSPITAL_COMMUNITY)
Admission: EM | Admit: 2021-11-19 | Discharge: 2021-11-19 | Disposition: A | Discharge status: Home / Self Care | Payer: Medicare Other | Attending: Emergency Medicine | Admitting: Emergency Medicine

## 2021-11-19 ENCOUNTER — Encounter (HOSPITAL_COMMUNITY): Payer: Self-pay | Admitting: Emergency Medicine

## 2021-11-19 ENCOUNTER — Emergency Department (HOSPITAL_COMMUNITY): Payer: Medicare Other

## 2021-11-19 DIAGNOSIS — R531 Weakness: Secondary | ICD-10-CM | POA: Diagnosis present

## 2021-11-19 DIAGNOSIS — R4781 Slurred speech: Secondary | ICD-10-CM | POA: Insufficient documentation

## 2021-11-19 DIAGNOSIS — Z20822 Contact with and (suspected) exposure to covid-19: Secondary | ICD-10-CM | POA: Diagnosis not present

## 2021-11-19 DIAGNOSIS — I10 Essential (primary) hypertension: Secondary | ICD-10-CM | POA: Insufficient documentation

## 2021-11-19 DIAGNOSIS — Z7982 Long term (current) use of aspirin: Secondary | ICD-10-CM | POA: Diagnosis not present

## 2021-11-19 DIAGNOSIS — Z79899 Other long term (current) drug therapy: Secondary | ICD-10-CM | POA: Insufficient documentation

## 2021-11-19 HISTORY — DX: Cerebral infarction, unspecified: I63.9

## 2021-11-19 LAB — CBC WITH DIFFERENTIAL/PLATELET
Abs Immature Granulocytes: 0.02 10*3/uL (ref 0.00–0.07)
Basophils Absolute: 0 10*3/uL (ref 0.0–0.1)
Basophils Relative: 0 %
Eosinophils Absolute: 0.1 10*3/uL (ref 0.0–0.5)
Eosinophils Relative: 1 %
HCT: 34.6 % — ABNORMAL LOW (ref 36.0–46.0)
Hemoglobin: 11.2 g/dL — ABNORMAL LOW (ref 12.0–15.0)
Immature Granulocytes: 0 %
Lymphocytes Relative: 20 %
Lymphs Abs: 2.3 10*3/uL (ref 0.7–4.0)
MCH: 29.2 pg (ref 26.0–34.0)
MCHC: 32.4 g/dL (ref 30.0–36.0)
MCV: 90.3 fL (ref 80.0–100.0)
Monocytes Absolute: 0.4 10*3/uL (ref 0.1–1.0)
Monocytes Relative: 4 %
Neutro Abs: 8.6 10*3/uL — ABNORMAL HIGH (ref 1.7–7.7)
Neutrophils Relative %: 75 %
Platelets: 378 10*3/uL (ref 150–400)
RBC: 3.83 MIL/uL — ABNORMAL LOW (ref 3.87–5.11)
RDW: 14.1 % (ref 11.5–15.5)
WBC: 11.5 10*3/uL — ABNORMAL HIGH (ref 4.0–10.5)
nRBC: 0 % (ref 0.0–0.2)

## 2021-11-19 LAB — BASIC METABOLIC PANEL
Anion gap: 6 (ref 5–15)
BUN: 18 mg/dL (ref 8–23)
CO2: 25 mmol/L (ref 22–32)
Calcium: 9.5 mg/dL (ref 8.9–10.3)
Chloride: 104 mmol/L (ref 98–111)
Creatinine, Ser: 1.12 mg/dL — ABNORMAL HIGH (ref 0.44–1.00)
GFR, Estimated: 55 mL/min — ABNORMAL LOW (ref >60)
Glucose, Bld: 101 mg/dL — ABNORMAL HIGH (ref 70–99)
Potassium: 3.8 mmol/L (ref 3.5–5.1)
Sodium: 135 mmol/L (ref 135–145)

## 2021-11-19 LAB — URINALYSIS, ROUTINE W REFLEX MICROSCOPIC
Bilirubin Urine: NEGATIVE
Glucose, UA: NEGATIVE mg/dL
Ketones, ur: NEGATIVE mg/dL
Nitrite: NEGATIVE
Protein, ur: NEGATIVE mg/dL
Specific Gravity, Urine: 1.006 (ref 1.005–1.030)
pH: 5 (ref 5.0–8.0)

## 2021-11-19 LAB — RESP PANEL BY RT-PCR (FLU A&B, COVID) ARPGX2
Influenza A by PCR: NEGATIVE
Influenza B by PCR: NEGATIVE
SARS Coronavirus 2 by RT PCR: NEGATIVE

## 2021-11-19 NOTE — ED Provider Notes (Signed)
?[facility] ?Provider Note ? ? ?CSN: [id number] ?Arrival date & time: 11/19/21  1307 ? ?  ? ?History ?Chief Complaint  ?Patient presents with  ? Weakness  ? ? ?Claire Hunt is a 66 y.o. female with history of stroke back in 2017 with no known deficits that I can find on record who presents to the emergency department today with a 1 week history of bilateral lower extremity weakness, slurred speech, and a weird taste in her mouth.  Patient states that she has been having to take care of her mother and husband with Alzheimer's and does not sleep much at night.  Patient denies any headache, numbness, chest pain, shortness of breath, fever, chills, urinary complaints apart from nocturia. ? ? ?Weakness ? ?  ? ?Home Medications ?Prior to Admission medications   ?Medication Sig Start Date End Date Taking? Authorizing Provider  ?amLODipine (NORVASC) 5 MG tablet Take 5 mg by mouth daily. 11/01/21  Yes [provider]  ?aspirin EC 325 MG EC tablet Take 1 tablet (325 mg total) by mouth daily. 06/03/16  Yes Jerald Kief, MD  ?atorvastatin (LIPITOR) 80 MG tablet Take 80 mg by mouth at bedtime. 10/12/21  Yes [provider]  ?lisinopril (ZESTRIL) 40 MG tablet Take 40 mg by mouth daily. 10/03/21  Yes [provider]  ?metoprolol tartrate (LOPRESSOR) 25 MG tablet Take 25 mg by mouth 2 (two) times daily.   Yes [provider]  ?   ? ?Allergies    ?Patient has no known allergies.   ? ?Review of Systems   ?Review of Systems  ?Neurological:  Positive for weakness.  ?All other systems reviewed and are negative. ? ?Physical Exam ?Updated Vital Signs ?BP (!) 163/84   Pulse 89   Temp 98.6 ?F (37 ?C) (Oral)   Resp 20   Ht 5' (1.524 m)   Wt 77.1 kg   SpO2 98%   BMI 33.20 kg/m?  ?Physical Exam ?Vitals and nursing note reviewed.  ?Constitutional:   ?   General: She is not in acute distress. ?   Appearance: Normal appearance.  ?HENT:  ?   Head: Normocephalic and atraumatic.   ?Eyes:  ?   General:     ?   Right eye: No discharge.     ?   Left eye: No discharge.  ?Cardiovascular:  ?   Comments: Regular rate and rhythm.  S1/S2 are distinct without any evidence of murmur, rubs, or gallops.  Radial pulses are 2+ bilaterally.  Dorsalis pedis pulses are 2+ bilaterally.  No evidence of pedal edema. ?Pulmonary:  ?   Comments: Clear to auscultation bilaterally.  Normal effort.  No respiratory distress.  No evidence of wheezes, rales, or rhonchi heard throughout. ?Abdominal:  ?   General: Abdomen is flat. Bowel sounds are normal. There is no distension.  ?   Tenderness: There is no abdominal tenderness. There is no guarding or rebound.  ?Musculoskeletal:     ?   General: Normal range of motion.  ?   Cervical back: Neck supple.  ?Skin: ?   General: Skin is warm and dry.  ?   Findings: No rash.  ?Neurological:  ?   General: No focal deficit present.  ?   Mental Status: She is alert.  ?   Comments: Cranial nerves II through XII are intact.  Pupils are equal round reactive to light.  Normal extraocular movements without any evidence of nystagmus.  5/5 strength to the upper  and lower extremities.  Normal sensation to the upper and lower extremities.  No pronator drift.  Normal finger-nose without any evidence of dysmetria.  Patient's speech is normal although she does not have her dentures in.  Answering all questions appropriately.  ?Psychiatric:     ?   Mood and Affect: Mood normal.     ?   Behavior: Behavior normal.  ? ? ?ED Results / Procedures / Treatments   ?Labs ?(all labs ordered are listed, but only abnormal results are displayed) ?Labs Reviewed  ?URINALYSIS, ROUTINE W REFLEX MICROSCOPIC - Abnormal; Notable for the following components:  ?    Result Value  ? Color, Urine STRAW (*)   ? Hgb urine dipstick SMALL (*)   ? Leukocytes,Ua TRACE (*)   ? Bacteria, UA RARE (*)   ? All other components within normal limits  ?BASIC METABOLIC PANEL - Abnormal; Notable for the following components:  ?  Glucose, Bld 101 (*)   ? Creatinine, Ser 1.12 (*)   ? GFR, Estimated 55 (*)   ? All other components within normal limits  ?CBC WITH DIFFERENTIAL/PLATELET - Abnormal; Notable for the following components:  ? WBC 11.5 (*)   ? RBC 3.83 (*)   ? Hemoglobin 11.2 (*)   ? HCT 34.6 (*)   ? Neutro Abs 8.6 (*)   ? All other components within normal limits  ?RESP PANEL BY RT-PCR (FLU A&B, COVID) ARPGX2  ? ? ?EKG ?None ? ?Radiology ?CT Head Wo Contrast ? ?Result Date: 11/19/2021 ?CLINICAL DATA:  66 year old female with acute slurred speech and bilateral leg weakness. EXAM: CT HEAD WITHOUT CONTRAST TECHNIQUE: Contiguous axial images were obtained from the base of the skull through the vertex without intravenous contrast. RADIATION DOSE REDUCTION: This exam was performed according to the departmental dose-optimization program which includes automated exposure control, adjustment of the mA and/or kV according to patient size and/or use of iterative reconstruction technique. COMPARISON:  06/02/2016 CT and MR FINDINGS: Brain: No evidence of acute infarction, hemorrhage, hydrocephalus, extra-axial collection or mass lesion/mass effect. Mild atrophy again noted. Vascular: Carotid atherosclerotic calcifications are noted. Skull: Normal. Negative for fracture or focal lesion. Sinuses/Orbits: No acute finding. Other: None. IMPRESSION: 1. No evidence of acute intracranial abnormality. Electronically Signed   By: Harmon PierJeffrey  Hu M.D.   On: 11/19/2021 14:51   ? ?Procedures ?Procedures  ? ? ?Medications Ordered in ED ?Medications - No data to display ? ?ED Course/ Medical Decision Making/ A&P ? ?                        ?Medical Decision Making ?Amount and/or Complexity of Data Reviewed ?Labs: ordered. ?Radiology: ordered. ? ? ?This patient presents to the ED for concern of bilateral lower extremity weakness and slurred speech, this involves an extensive number of treatment options, and is a complaint that carries with it a high risk of  complications and morbidity.  The differential diagnosis includes stroke, complex migraine, intracranial hemorrhage, electrolyte abnormalities, infection. ? ? ?Co morbidities that complicate the patient evaluation ? ?Past Medical History:  ?Diagnosis Date  ? Hypercholesteremia   ? Hypertension   ? Stroke [facility](HCC)   ? ? ?Additional history obtained: ? ?Additional history obtained from nursing note ?External records from outside source obtained and reviewed including note from when she had a previous stroke back in 2017.  At that time, the patient presented with right-sided deficits and slurred speech. Patient was out of tPA window at that  time. ? ? ?Lab Tests: ? ?I Ordered, and personally interpreted labs.  The pertinent results include: CBC shows evidence of leukocytosis and chronic anemia.  Urinalysis does not show any significant signs of infection at this time.  BMP shows elevated creatinine.  COVID and flu is negative. ? ? ?Imaging Studies ordered: ? ?I ordered imaging studies including CT head without contrast ?I independently visualized and interpreted imaging which showed no acute abnormalities. ?I agree with the radiologist interpretation ? ? ?Cardiac Monitoring: ? ?The patient was maintained on a cardiac monitor.  I personally viewed and interpreted the cardiac monitored which showed an underlying rhythm of: Normal sinus rhythm ? ? ? ?Test Considered: ? ?MRI.  Patient has no neurological deficits at this time.  This is been ongoing for a week.  Again touch base with neurology and we both feel that this can be managed in the outpatient setting closely this week with neurology. ? ? ?Critical Interventions: ? ?N/A ? ? ?Consultations Obtained: ? ?See below ? ? ?Problem List / ED Course: ? ?Patient presents to the emergency department today with 1 week history of slurred speech and bilateral lower leg weakness.  Her neurological exam is completely normal here.  No evidence of weakness that I can find on my exam.   CT head was negative.  Spoke with neurology as highlighted below recommended outpatient follow-up sometime this week.  I discussed red flag symptoms with the patient at the bedside including unilateral weakness, numbness, return

## 2021-11-19 NOTE — Discharge Instructions (Signed)
Please follow-up with neurology in 1 week.  Return to the emergency department for any of the symptoms that we discussed. ? ?

## 2021-11-19 NOTE — ED Triage Notes (Addendum)
Patient c/o slurred speech, with funny feeling to mouth, and weakness in legs bilaterally. Denies any weakness on a certain side. Denies any dizziness. Per patient intermittent headache but denies any at this time. Patient states symptoms started approx 1 week ago. Per patient hx of stroke. Denies taking any type of anticoagulant.  ?

## 2022-06-23 ENCOUNTER — Other Ambulatory Visit: Payer: Self-pay | Admitting: Family Medicine

## 2022-06-23 DIAGNOSIS — Z1231 Encounter for screening mammogram for malignant neoplasm of breast: Secondary | ICD-10-CM

## 2024-03-14 ENCOUNTER — Other Ambulatory Visit: Payer: Self-pay | Admitting: Family Medicine

## 2024-03-14 DIAGNOSIS — Z1231 Encounter for screening mammogram for malignant neoplasm of breast: Secondary | ICD-10-CM
